# Patient Record
Sex: Male | Born: 1955 | Race: Asian | Hispanic: No | Marital: Married | Smoking: Never smoker
Health system: Southern US, Community
[De-identification: ages and names within clinical notes are randomized; demographics above are authoritative.]

## PROBLEM LIST (undated history)

## (undated) ENCOUNTER — Emergency Department (HOSPITAL_COMMUNITY): Discharge status: Absent without leave | Payer: Self-pay | Attending: Emergency Medicine | Admitting: Emergency Medicine

## (undated) DIAGNOSIS — T7840XA Allergy, unspecified, initial encounter: Secondary | ICD-10-CM

## (undated) HISTORY — PX: TONSILLECTOMY: SUR1361

## (undated) HISTORY — DX: Allergy, unspecified, initial encounter: T78.40XA

---

## 2004-10-12 ENCOUNTER — Emergency Department (HOSPITAL_COMMUNITY): Admission: EM | Admit: 2004-10-12 | Discharge: 2004-10-12 | Payer: Self-pay | Admitting: Emergency Medicine

## 2004-12-08 ENCOUNTER — Ambulatory Visit: Payer: Self-pay | Admitting: Internal Medicine

## 2004-12-10 ENCOUNTER — Ambulatory Visit: Payer: Self-pay | Admitting: Internal Medicine

## 2005-03-01 ENCOUNTER — Ambulatory Visit: Payer: Self-pay | Admitting: Internal Medicine

## 2005-03-05 ENCOUNTER — Ambulatory Visit: Payer: Self-pay | Admitting: Internal Medicine

## 2005-06-24 ENCOUNTER — Ambulatory Visit: Payer: Self-pay | Admitting: Internal Medicine

## 2005-06-25 ENCOUNTER — Ambulatory Visit: Payer: Self-pay | Admitting: Internal Medicine

## 2005-10-14 ENCOUNTER — Ambulatory Visit: Payer: Self-pay | Admitting: Internal Medicine

## 2005-10-22 ENCOUNTER — Ambulatory Visit: Payer: Self-pay | Admitting: Internal Medicine

## 2006-03-04 ENCOUNTER — Ambulatory Visit: Payer: Self-pay | Admitting: Internal Medicine

## 2006-03-08 ENCOUNTER — Ambulatory Visit: Payer: Self-pay | Admitting: Internal Medicine

## 2006-07-01 ENCOUNTER — Ambulatory Visit: Payer: Self-pay | Admitting: Gastroenterology

## 2006-07-25 ENCOUNTER — Ambulatory Visit: Payer: Self-pay | Admitting: Internal Medicine

## 2006-07-27 ENCOUNTER — Ambulatory Visit: Payer: Self-pay | Admitting: Internal Medicine

## 2006-08-02 ENCOUNTER — Ambulatory Visit: Payer: Self-pay | Admitting: Gastroenterology

## 2006-08-22 ENCOUNTER — Inpatient Hospital Stay (HOSPITAL_COMMUNITY): Admission: EM | Admit: 2006-08-22 | Discharge: 2006-08-26 | Payer: Self-pay | Admitting: Emergency Medicine

## 2006-08-22 ENCOUNTER — Ambulatory Visit: Payer: Self-pay | Admitting: Family Medicine

## 2006-08-22 ENCOUNTER — Encounter: Payer: Self-pay | Admitting: *Deleted

## 2007-12-21 IMAGING — RF DG FLUORO GUIDE NDL PLC/BX
1 series · 1 of 1 positions shown · non-contrast
Comparison: none

CLINICAL DATA: 49-year-old, seizures.  Mental status change. 
 FLUOROSCOPIC GUIDED LUMBAR PUNCTURE:
 Procedure:  Informed consent was obtained from the patient?s brother via telephone.  Patient had to be medicated and was not coherent.  He was somewhat combative and we had to do this on his left side.  An appropriate site for lumbar puncture was marked on the skin at the L2-3 level.  The patient was then prepped and draped in the usual sterile fashion and local anesthesia was achieved with 1% Xylocaine.  A 20 gauge spinal needle was then inserted into the thecal sac at the L2-3 level with subsequent free flow of clear CSF.  Approximately 10 cc was obtained for appropriate laboratory evaluation.

[Series 1: run · 1 of 1 slices shown]
[im 1/1]
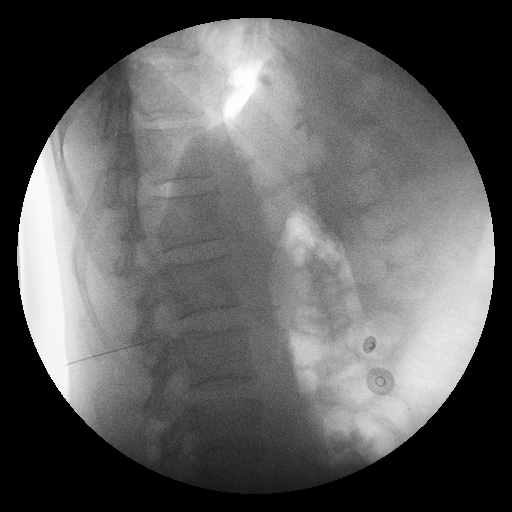

[1 of 1 positions shown; findings below may reference images not displayed]

IMPRESSION: Fluoroscopic guided lumbar puncture with 10 cc of clear CSF obtained for appropriate laboratory evaluation.

## 2011-10-15 ENCOUNTER — Ambulatory Visit (INDEPENDENT_AMBULATORY_CARE_PROVIDER_SITE_OTHER): Payer: BC Managed Care – PPO

## 2011-10-15 DIAGNOSIS — J0100 Acute maxillary sinusitis, unspecified: Secondary | ICD-10-CM

## 2011-10-15 DIAGNOSIS — I10 Essential (primary) hypertension: Secondary | ICD-10-CM

## 2012-01-19 ENCOUNTER — Other Ambulatory Visit: Payer: Self-pay | Admitting: Family Medicine

## 2012-01-24 ENCOUNTER — Other Ambulatory Visit: Payer: Self-pay | Admitting: Family Medicine

## 2012-02-21 ENCOUNTER — Other Ambulatory Visit: Payer: Self-pay | Admitting: Physician Assistant

## 2012-03-01 ENCOUNTER — Other Ambulatory Visit: Payer: Self-pay | Admitting: Family Medicine

## 2012-03-03 ENCOUNTER — Other Ambulatory Visit: Payer: Self-pay | Admitting: Physician Assistant

## 2012-07-24 ENCOUNTER — Other Ambulatory Visit: Payer: Self-pay | Admitting: Physician Assistant

## 2012-07-25 NOTE — Telephone Encounter (Signed)
Please pull paper chart.  

## 2012-07-25 NOTE — Telephone Encounter (Signed)
Chart pulled to PA pool at nurses station [phone]

## 2012-08-01 ENCOUNTER — Ambulatory Visit (INDEPENDENT_AMBULATORY_CARE_PROVIDER_SITE_OTHER): Payer: BC Managed Care – PPO | Admitting: Internal Medicine

## 2012-08-01 VITALS — BP 160/96 | HR 120 | Temp 98.7°F | Resp 18 | Ht 68.5 in | Wt 165.4 lb

## 2012-08-01 DIAGNOSIS — I10 Essential (primary) hypertension: Secondary | ICD-10-CM

## 2012-08-01 DIAGNOSIS — Z0000 Encounter for general adult medical examination without abnormal findings: Secondary | ICD-10-CM

## 2012-08-01 DIAGNOSIS — Z319 Encounter for procreative management, unspecified: Secondary | ICD-10-CM

## 2012-08-01 DIAGNOSIS — R05 Cough: Secondary | ICD-10-CM

## 2012-08-01 DIAGNOSIS — E785 Hyperlipidemia, unspecified: Secondary | ICD-10-CM

## 2012-08-01 DIAGNOSIS — G4700 Insomnia, unspecified: Secondary | ICD-10-CM

## 2012-08-01 DIAGNOSIS — J45909 Unspecified asthma, uncomplicated: Secondary | ICD-10-CM

## 2012-08-01 DIAGNOSIS — J309 Allergic rhinitis, unspecified: Secondary | ICD-10-CM | POA: Insufficient documentation

## 2012-08-01 DIAGNOSIS — K219 Gastro-esophageal reflux disease without esophagitis: Secondary | ICD-10-CM

## 2012-08-01 DIAGNOSIS — Z23 Encounter for immunization: Secondary | ICD-10-CM

## 2012-08-01 DIAGNOSIS — N529 Male erectile dysfunction, unspecified: Secondary | ICD-10-CM

## 2012-08-01 DIAGNOSIS — J019 Acute sinusitis, unspecified: Secondary | ICD-10-CM

## 2012-08-01 DIAGNOSIS — J329 Chronic sinusitis, unspecified: Secondary | ICD-10-CM

## 2012-08-01 LAB — POCT CBC
Lymph, poc: 1.1 (ref 0.6–3.4)
MCH, POC: 29.6 pg (ref 27–31.2)
MCHC: 31.3 g/dL — AB (ref 31.8–35.4)
MCV: 94.7 fL (ref 80–97)
MID (cbc): 0.3 (ref 0–0.9)
POC LYMPH PERCENT: 20.5 %L (ref 10–50)
Platelet Count, POC: 357 10*3/uL (ref 142–424)
RBC: 5.58 M/uL (ref 4.69–6.13)
RDW, POC: 13.3 %
WBC: 5.4 10*3/uL (ref 4.6–10.2)

## 2012-08-01 LAB — COMPREHENSIVE METABOLIC PANEL
AST: 16 U/L (ref 0–37)
BUN: 11 mg/dL (ref 6–23)
Calcium: 10 mg/dL (ref 8.4–10.5)
Chloride: 103 mEq/L (ref 96–112)
Creat: 0.86 mg/dL (ref 0.50–1.35)
Total Bilirubin: 1 mg/dL (ref 0.3–1.2)

## 2012-08-01 LAB — LIPID PANEL
HDL: 41 mg/dL (ref >39)
Total CHOL/HDL Ratio: 4.4 Ratio

## 2012-08-01 MED ORDER — SIMVASTATIN 20 MG PO TABS: 20.0000 mg | ORAL_TABLET | Freq: Every day | ORAL | Status: DC

## 2012-08-01 MED ORDER — ALBUTEROL SULFATE HFA 108 (90 BASE) MCG/ACT IN AERS: 2.0000 | INHALATION_SPRAY | Freq: Four times a day (QID) | RESPIRATORY_TRACT | Status: DC | PRN

## 2012-08-01 MED ORDER — FLUOCINONIDE-E 0.05 % EX CREA: TOPICAL_CREAM | CUTANEOUS | Status: DC

## 2012-08-01 MED ORDER — HYDROCODONE-HOMATROPINE 5-1.5 MG/5ML PO SYRP: 5.0000 mL | ORAL_SOLUTION | Freq: Four times a day (QID) | ORAL | Status: AC | PRN

## 2012-08-01 MED ORDER — AMOXICILLIN 500 MG PO CAPS: 1000.0000 mg | ORAL_CAPSULE | Freq: Two times a day (BID) | ORAL | Status: DC

## 2012-08-01 MED ORDER — VALSARTAN 80 MG PO TABS: 80.0000 mg | ORAL_TABLET | Freq: Every day | ORAL | Status: DC

## 2012-08-01 MED ORDER — BECLOMETHASONE DIPROPIONATE 40 MCG/ACT IN AERS: 2.0000 | INHALATION_SPRAY | Freq: Two times a day (BID) | RESPIRATORY_TRACT | Status: DC

## 2012-08-01 MED ORDER — PANTOPRAZOLE SODIUM 40 MG PO TBEC: 40.0000 mg | DELAYED_RELEASE_TABLET | Freq: Every day | ORAL | Status: DC

## 2012-08-01 MED ORDER — CLONAZEPAM 0.5 MG PO TABS: 0.5000 mg | ORAL_TABLET | Freq: Every evening | ORAL | Status: DC | PRN

## 2012-08-01 NOTE — Progress Notes (Addendum)
Subjective:    Patient ID: Ronald Brennan, male    DOB: 06-11-1956, 56 y.o.   MRN: [id number]  HPIcough 1 week/green sputum occas/no fever/restarted qvar and zyrtec but no better Lots of sinus pressure/allergies are currently active, spite medication  Spots on scalp 6 months/no hair loss/no itching/changing color of hair  Fell-pain in shoulder after falling--4 weeks ago/Slowly improving with use/no nocturnal pain  Spot in mouth,As in past/recent dental evaluation since no problem  Also here for blood work and followup for other medical problems Patient Active Problem List  Diagnosis  . GERD (gastroesophageal reflux disease)-Stable with intermittent medication  . AR (allergic rhinitis)  . Insomnia/anxiety-Stable with intermittent Klonopin for sleep  . HTN (hypertension)-Does not take home blood pressures  . Hyperlipidemia-Continues on Zocor without side effects  . RAD (reactive airway disease)-Stable on medication   Additionally has concerns about the fact that he and his wife have been unable to have a baby/they just returned from 4 monthsIn their home country of [place], Where both were evaluated for infertility. Apparently insemination was attempted but failed. He was sent home with medications that included l-carnitine and clonidine but I'm not sure why-he thinks they were to promote his fertility. They are frustrated and wished to pursue workup here. She is a patient of Dr. Dierdre Forth  Review of Systems No vision changes/no hearing changes No headaches Chest pain or palpitations/no edema No shortness of breath No changes in appetite No genitourinary complaints/? Whether erectle function is intact or Whether anxiety is the culprit    Objective:   Physical Exam Filed Vitals:   08/01/12 1019  BP: 160/96  Pulse: 120  Temp: 98.7 F (37.1 C)  Resp: 18  There are 2 1.5 cm patches in a symmetrical fashion on his occiput of hair that is white/no underlying skin changes  seen Pupils equal round reactive to light and accommodation/extraocular movements conjugate TMs clear Nares boggy with purulent mucus/maxillary is tender to percussion Oral pharynx has a mucoid cyst behind the left lower molar No lymphadenopathy or thyromegaly Lungs clear Heart regular without murmur Extremities with full peripheral pulses/ no edema The left shoulder now has a good range of motion although there is mild discomfort with ab duction against resistance        Assessment & Plan:   1. Cough  HYDROcodone-homatropine (HYCODAN) 5-1.5 MG/5ML syrup, POCT CBC  2. Sinusitis  amoxicillin (AMOXIL) 500 MG capsule  3. GERD (gastroesophageal reflux disease)  pantoprazole (PROTONIX) 40 MG tablet  4. AR (allergic rhinitis)    5. Insomnia/anxiety  clonazePAM (KLONOPIN) 0.5 MG tablet  6. HTN (hypertension)  valsartan (DIOVAN) 80 MG tablet, Comprehensive metabolic panel  7. Hyperlipidemia  simvastatin (ZOCOR) 20 MG tablet, Lipid panel  8. RAD (reactive airway disease)  albuterol (PROAIR HFA) 108 (90 BASE) MCG/ACT inhaler, beclomethasone (QVAR) 40 MCG/ACT inhaler  9. ED (erectile dysfunction)  PSA, Testosterone  10. Infertility management  Ambulatory referral to Urology  11. Preventative health care  Flu vaccine greater than or equal to 3yo preservative free IM  12.Recent shoulder injury resolving  Meds ordered this encounter  Medications  . Multiple Vitamins-Minerals (CENTRUM SILVER ADULT 50+ PO)    Sig: Take by mouth.  . DISCONTD: beclomethasone (QVAR) 40 MCG/ACT inhaler    Sig: Inhale 2 puffs into the lungs 2 (two) times daily.  . cetirizine (ZYRTEC) 10 MG tablet    Sig: Take 10 mg by mouth daily.  Marland Kitchen dextromethorphan-guaiFENesin (MUCINEX DM) 30-600 MG per 12 hr  tablet    Sig: Take 1 tablet by mouth every 12 (twelve) hours.  . fluocinonide-emollient (LIDEX-E) 0.05 % cream    Sig: Apply at bedtime for 1 month    Dispense:  30 g    Refill:  0  . amoxicillin (AMOXIL) 500 MG  capsule    Sig: Take 2 capsules (1,000 mg total) by mouth 2 (two) times daily.    Dispense:  40 capsule    Refill:  0  . HYDROcodone-homatropine (HYCODAN) 5-1.5 MG/5ML syrup    Sig: Take 5 mLs by mouth every 6 (six) hours as needed for cough.    Dispense:  120 mL    Refill:  0  . simvastatin (ZOCOR) 20 MG tablet    Sig: Take 1 tablet (20 mg total) by mouth daily.    Dispense:  90 tablet    Refill:  3  . albuterol (PROAIR HFA) 108 (90 BASE) MCG/ACT inhaler    Sig: Inhale 2 puffs into the lungs every 6 (six) hours as needed for wheezing.    Dispense:  1 Inhaler    Refill:  2  . pantoprazole (PROTONIX) 40 MG tablet    Sig: Take 1 tablet (40 mg total) by mouth daily. Needs office visit    Dispense:  30 tablet    Refill:  0  . valsartan (DIOVAN) 80 MG tablet    Sig: Take 1 tablet (80 mg total) by mouth daily.    Dispense:  90 tablet    Refill:  3  . clonazePAM (KLONOPIN) 0.5 MG tablet    Sig: Take 1 tablet (0.5 mg total) by mouth at bedtime as needed for anxiety.    Dispense:  30 tablet    Refill:  0  . beclomethasone (QVAR) 40 MCG/ACT inhaler    Sig: Inhale 2 puffs into the lungs 2 (two) times daily.    Dispense:  1 Inhaler    Refill:  2   Ref Urology To work on infertility/his wife to see. GYN F/u dentist 4 oral lesion

## 2012-08-02 ENCOUNTER — Encounter: Payer: Self-pay | Admitting: Internal Medicine

## 2012-08-02 LAB — PSA: PSA: 0.94 ng/mL (ref <=4.00)

## 2012-08-05 ENCOUNTER — Telehealth: Payer: Self-pay

## 2012-08-05 DIAGNOSIS — J329 Chronic sinusitis, unspecified: Secondary | ICD-10-CM

## 2012-08-05 MED ORDER — AMOXICILLIN 500 MG PO CAPS: 1000.0000 mg | ORAL_CAPSULE | Freq: Two times a day (BID) | ORAL | Status: AC

## 2012-08-05 NOTE — Telephone Encounter (Signed)
Pt has lost his rx for amoxicillin and would like for Dr to call in anther rx please contact pt [phone]

## 2012-08-05 NOTE — Telephone Encounter (Signed)
Rx sent in

## 2012-08-06 NOTE — Telephone Encounter (Signed)
Pt.notified

## 2012-08-11 ENCOUNTER — Encounter: Payer: Self-pay | Admitting: Internal Medicine

## 2012-08-11 ENCOUNTER — Telehealth: Payer: Self-pay

## 2012-08-11 NOTE — Telephone Encounter (Signed)
The following was originally opened under another pt's chart in error and I have copied the messages and pasted them in to this phone encounter:  Tonye Pearson, MD 08/11/2012 3:10 PM Signed  It was his brother [place]  I sent the letter to you Adela Glimpse 08/10/2012 7:43 PM Signed  PATIENT CALLED IN REGARDS TO WAITING FOR A LETTER FROM DOOLITTLE EXPLAINING SHOULDER PAIN FOR COURT. HE WAS SEEN LAST WEEK AND WAS WONDERING IF IT WAS READY OR WHEN CAN HE COME AND PICK THAT LETTER UP. THANK YOU!

## 2012-08-11 NOTE — Telephone Encounter (Signed)
Notified pt that letter is finished and was mailed to him, but offered to make another copy for p/up. Pt advised he would like to p/up. Copy in drawer.

## 2012-09-18 ENCOUNTER — Telehealth: Payer: Self-pay

## 2012-09-18 NOTE — Telephone Encounter (Signed)
PT IS DR DOOLITTLE'S PT AND WOULD LIKE A CALL BACK FROM HIM AT [phone]

## 2012-09-18 NOTE — Telephone Encounter (Signed)
I have called patient, he wants to know if you know a surgeon for his gums, he is asking for a periodontist. Please advise.

## 2012-09-18 NOTE — Telephone Encounter (Signed)
[phone] drs lutins, benitez and [employer]

## 2012-09-19 ENCOUNTER — Telehealth: Payer: Self-pay

## 2012-09-19 NOTE — Telephone Encounter (Signed)
PATIENT CALLED TO ASK ABOUT PERIODONTIST. I VERIFYED IN CHART WHAT DOOLITTLE WROTE AND TOLD PATIENT OVER PHONE.

## 2012-09-19 NOTE — Telephone Encounter (Signed)
LMOM to CB on both #s.

## 2012-09-24 ENCOUNTER — Ambulatory Visit (INDEPENDENT_AMBULATORY_CARE_PROVIDER_SITE_OTHER): Payer: BC Managed Care – PPO | Admitting: Internal Medicine

## 2012-09-24 VITALS — BP 147/91 | HR 108 | Temp 98.8°F | Resp 16 | Ht 68.34 in | Wt 166.0 lb

## 2012-09-24 DIAGNOSIS — R05 Cough: Secondary | ICD-10-CM

## 2012-09-24 DIAGNOSIS — M25569 Pain in unspecified knee: Secondary | ICD-10-CM

## 2012-09-24 DIAGNOSIS — J329 Chronic sinusitis, unspecified: Secondary | ICD-10-CM

## 2012-09-24 MED ORDER — HYDROCODONE-HOMATROPINE 5-1.5 MG/5ML PO SYRP: 5.0000 mL | ORAL_SOLUTION | Freq: Four times a day (QID) | ORAL | Status: AC | PRN

## 2012-09-24 MED ORDER — AMOXICILLIN 500 MG PO CAPS: 1000.0000 mg | ORAL_CAPSULE | Freq: Two times a day (BID) | ORAL | Status: AC

## 2012-09-24 NOTE — Progress Notes (Signed)
  Subjective:    Patient ID: Ronald Brennan, male    DOB: 18-Jul-1956, 56 y.o.   MRN: [id number]  HPI Cough with postnasal drainage and face pressure for 7 days Started self on leftover amoxicillin and has improved over the last 24 hours No fever Cough nonproductive  Knee pain-2 months after jumping off a truck--using heat  treatments for relief Worse with activity/no swelling/no crepitus/no go black Review of Systems No night sweats/no wheezing/no conjunctivitis or sneezing    Objective:   Physical Exam No acute distress Blood pressure 147/91 TMs clear Nares boggy and purulent Throat clear Chest clear to auscultation  Left knee with tenderness along the medial joint line/pain with pressure there but negative stressor No crepitus/Lachman's and drawer are negative No effusion Patellar ballottement freely       Assessment & Plan:  Problem #1 sinusitis with cough Meds ordered this encounter  Medications  . amoxicillin (AMOXIL) 500 MG capsule    Sig: Take 2 capsules (1,000 mg total) by mouth 2 (two) times daily.    Dispense:  40 capsule    Refill:  0  . HYDROcodone-homatropine (HYCODAN) 5-1.5 MG/5ML syrup    Sig: Take 5 mLs by mouth every 6 (six) hours as needed for cough.    Dispense:  120 mL    Refill:  0   Problem #2 knee pain= tendinitis secondary to recent strain versus mild meniscus injury Continue heat and stretching and follow up if not well 1-2 mos  Recheck blood pressure control and well

## 2012-09-26 ENCOUNTER — Other Ambulatory Visit: Payer: Self-pay | Admitting: Physician Assistant

## 2012-10-12 ENCOUNTER — Telehealth: Payer: Self-pay

## 2012-10-12 NOTE — Telephone Encounter (Signed)
We did see patient on 07/2012 for shoulder pain, but I do not see where you recommended he not wear seat belt, please advise if you can do this. Amy

## 2012-10-12 NOTE — Telephone Encounter (Signed)
Thank you I reprinted letter, will have you sign so he can use this in court. Amy

## 2012-10-12 NOTE — Telephone Encounter (Signed)
PT STATES DR Merla Riches WAS TO WRITE A NOTE STATING THE REASON HE WASN'T WEARING A SEAT BELT IS BECAUSE HE HAD A SHOULDER INJURY REALLY NEED TO HAVE ASAP FOR COURT. PLEASE CALL [phone]

## 2012-10-12 NOTE — Telephone Encounter (Signed)
See letter 11/1 He chose not to wear seat belt cause it hurt---this was prior to visit and he was ticketed prior to visit--wanted note to explain his injury

## 2012-10-14 ENCOUNTER — Ambulatory Visit (INDEPENDENT_AMBULATORY_CARE_PROVIDER_SITE_OTHER): Payer: BC Managed Care – PPO | Admitting: Emergency Medicine

## 2012-10-14 VITALS — BP 171/96 | HR 98 | Temp 97.8°F | Resp 16 | Ht 69.0 in | Wt 173.0 lb

## 2012-10-14 DIAGNOSIS — R21 Rash and other nonspecific skin eruption: Secondary | ICD-10-CM

## 2012-10-14 DIAGNOSIS — H109 Unspecified conjunctivitis: Secondary | ICD-10-CM

## 2012-10-14 DIAGNOSIS — L84 Corns and callosities: Secondary | ICD-10-CM

## 2012-10-14 MED ORDER — OFLOXACIN 0.3 % OP SOLN: 1.0000 [drp] | Freq: Four times a day (QID) | OPHTHALMIC | Status: DC

## 2012-10-14 MED ORDER — HYDROCORTISONE 1 % EX OINT: TOPICAL_OINTMENT | Freq: Two times a day (BID) | CUTANEOUS | Status: DC

## 2012-10-14 MED ORDER — TRIAMCINOLONE 0.1 % CREAM:EUCERIN CREAM 1:1: TOPICAL_CREAM | CUTANEOUS | Status: DC

## 2012-10-14 NOTE — Progress Notes (Signed)
  Subjective:    Patient ID: Ronald Brennan, male    DOB: 1956/07/26, 57 y.o.   MRN: [id number]  HPI pt c/o eyes burning, discharge x a few days. Does not know if he got anything in his eye. He was working in the yard last week. He is itching around his eyes and used a cream from the pharmacy without any relief. He had a cold and was seen here for that. He received Amoxicillin for that, but didn't finish the dose. He is requesting more oral antibiotics but does not have any symptoms of sinusitis or bronchitis at the present time. He does have an area on his right foot he is concerned about. Apparently the skin in that area tends to crack and give him discomfort    Review of Systems     Objective:   Physical Exam the skin around both eyes is somewhat dry and scaly. Pupils themselves are equal and reactive to light. The conjunctiva is mildly injected. Disc margins are sharp . There are no preauricular nodes felt to Examination the right lateral heel reveals the skin to be thickened in this area with a crack through the skin . Blood pressure was repeated and was 160/90      Assessment & Plan:  She will be treated with hydrocortisone cream 1% for the area around his eyes . I placed him on Ocuflox drops daily Zetia and triamcinolone cream for the area around his heel

## 2012-10-14 NOTE — Patient Instructions (Addendum)
Conjunctivitis Conjunctivitis is commonly called "pink eye." Conjunctivitis can be caused by bacterial or viral infection, allergies, or injuries. There is usually redness of the lining of the eye, itching, discomfort, and sometimes discharge. There may be deposits of matter along the eyelids. A viral infection usually causes a watery discharge, while a bacterial infection causes a yellowish, thick discharge. Pink eye is very contagious and spreads by direct contact. You may be given antibiotic eyedrops as part of your treatment. Before using your eye medicine, remove all drainage from the eye by washing gently with warm water and cotton balls. Continue to use the medication until you have awakened 2 mornings in a row without discharge from the eye. Do not rub your eye. This increases the irritation and helps spread infection. Use separate towels from other household members. Wash your hands with soap and water before and after touching your eyes. Use cold compresses to reduce pain and sunglasses to relieve irritation from light. Do not wear contact lenses or wear eye makeup until the infection is gone. SEEK MEDICAL CARE IF:   Your symptoms are not better after 3 days of treatment.  You have increased pain or trouble seeing.  The outer eyelids become very red or swollen. Document Released: 11/04/2004 Document Revised: 12/20/2011 Document Reviewed: 09/27/2005 [employer] Patient Information 2013 [employer].  

## 2012-10-15 ENCOUNTER — Telehealth: Payer: Self-pay

## 2012-10-15 NOTE — Telephone Encounter (Signed)
PATIENT SAW DR. Merla Riches AND HAS SAID HE GOT A WRITTEN RX FOR ANTIBIOTICS AND HAS LOST THE RX FOR AMOXACILLIN 500 MG AND WANTS A NEW ONE. IF ITS POSSIBLE TO GET ONE TODAY...Marland KitchenMarland Kitchen

## 2012-10-16 ENCOUNTER — Telehealth: Payer: Self-pay | Admitting: Radiology

## 2012-10-16 NOTE — Telephone Encounter (Signed)
Patient was given the Rx on 09/24/12. Can not replace it now. Called him to advise. He will come in this evening to see Dr Merla Riches

## 2012-10-16 NOTE — Telephone Encounter (Signed)
Patient states the letter you wrote was not accepted he wants it rewritten to state nothing could touch his shoulder. I have explained to him multiple times you can not change the letter. He is asking if you can write an additional letter to state he could not wear the seatbelt because nothing could touch the shoulder. He is adamant that I send another message to you.,please advise. I told him I will call him back.

## 2012-10-17 ENCOUNTER — Encounter: Payer: Self-pay | Admitting: Internal Medicine

## 2012-10-19 ENCOUNTER — Telehealth: Payer: Self-pay | Admitting: Radiology

## 2012-10-19 NOTE — Telephone Encounter (Signed)
I have spoken to patient again. He is insisting we get him a letter for not

## 2012-10-19 NOTE — Telephone Encounter (Signed)
I have spoken to patient again about his seatbelt ticket advised him additional letters are not going to be provided. He wants to discuss with Dr Merla Riches, I have advised him he can make an appt but we will still be unable to provide him with further letters. He is asking for copies of previous letters. These are put at front desk for him to pick up.

## 2012-11-01 ENCOUNTER — Ambulatory Visit: Payer: BC Managed Care – PPO | Admitting: Internal Medicine

## 2012-11-20 ENCOUNTER — Ambulatory Visit: Payer: BC Managed Care – PPO

## 2012-11-20 ENCOUNTER — Ambulatory Visit (INDEPENDENT_AMBULATORY_CARE_PROVIDER_SITE_OTHER): Payer: BC Managed Care – PPO | Admitting: Internal Medicine

## 2012-11-20 VITALS — BP 169/93 | HR 100 | Temp 98.2°F | Resp 16 | Ht 68.0 in | Wt 170.0 lb

## 2012-11-20 DIAGNOSIS — M25562 Pain in left knee: Secondary | ICD-10-CM

## 2012-11-20 DIAGNOSIS — M25569 Pain in unspecified knee: Secondary | ICD-10-CM

## 2012-11-20 DIAGNOSIS — M7122 Synovial cyst of popliteal space [Baker], left knee: Secondary | ICD-10-CM

## 2012-11-20 DIAGNOSIS — M7120 Synovial cyst of popliteal space [Baker], unspecified knee: Secondary | ICD-10-CM

## 2012-11-20 MED ORDER — AMOXICILLIN 875 MG PO TABS: 875.0000 mg | ORAL_TABLET | Freq: Two times a day (BID) | ORAL | Status: DC

## 2012-11-20 MED ORDER — HYDROCORTISONE 2.5 % RE CREA: TOPICAL_CREAM | Freq: Two times a day (BID) | RECTAL | Status: DC

## 2012-11-20 NOTE — Progress Notes (Signed)
  Subjective:    Patient ID: Ronald Brennan, male    DOB: [birth year], 57 y.o.   MRN: [id number]  HPI has noticed a swelling behind his left knee for 2-3 weeks Relatively painless Has a history of chronic anterior knee pain for 2 years that is intermittent and not disabling Never effusion No history of arthritis Able to exercise  Also has questions about a scalp lesion that is tender for one week/he has similar lesions frequently they come and go without treatment in the scalp  He also would like a refill of his hemorrhoid ointment He would like medicines to have on hand for his recurrent sinus infections when he travels    Review of Systems No fever chills or night sweats No weight loss No chest pain/on meds for hypertension No GI problems/home meds for GERD    Objective:   Physical Exam No acute distress but he is anxious as he often is Blood pressure 163/93 Has a hair follicle irritation on his scalp His left knee has no effusion and no redness He has a full range of motion without pain No instability is noted with stressors There is a large golf ball size cyst in the popliteal area  [facility] reading (PRIMARY) by  Dr. Mikiah Demond=NAD      Assessment & Plan:  P#1 Popliteal Cyst Referred to Dr. Althea Charon for treatment  Problem #2 scalp folliculitis Discussed prevention  Problem 3 recurrent hemorrhoids Refill Anusol HC  Problem #4 recurrent sinusitis Amoxicillin to have at home  #5 hypertension not well controlled He needs to take outside blood pressures over the next 3 weeks and followup if not controlled

## 2012-12-02 ENCOUNTER — Telehealth: Payer: Self-pay | Admitting: *Deleted

## 2012-12-02 NOTE — Telephone Encounter (Signed)
[facility] [address] requesting refill on protonix 40mg . Last fill 09/27/12

## 2012-12-03 MED ORDER — PANTOPRAZOLE SODIUM 40 MG PO TBEC: 40.0000 mg | DELAYED_RELEASE_TABLET | Freq: Every day | ORAL | Status: DC

## 2012-12-03 NOTE — Telephone Encounter (Signed)
Done

## 2013-04-29 ENCOUNTER — Other Ambulatory Visit: Payer: Self-pay | Admitting: Internal Medicine

## 2013-04-29 NOTE — Telephone Encounter (Signed)
Patient wants Diovan (non-generic version) sent to [facility] on [address]. [phone]

## 2013-05-11 ENCOUNTER — Telehealth: Payer: Self-pay

## 2013-05-11 MED ORDER — AMOXICILLIN 875 MG PO TABS: 875.0000 mg | ORAL_TABLET | Freq: Two times a day (BID) | ORAL | Status: DC

## 2013-05-11 NOTE — Telephone Encounter (Signed)
Relapse Meds ordered this encounter  Medications  . amoxicillin (AMOXIL) 875 MG tablet    Sig: Take 1 tablet (875 mg total) by mouth 2 (two) times daily. For sinusitis    Dispense:  20 tablet    Refill:  0

## 2013-05-11 NOTE — Telephone Encounter (Signed)
Can not do this without office visit. Called patient to advise. He states he was told by Dr Merla Riches he could do this, please advise.

## 2013-05-11 NOTE — Telephone Encounter (Signed)
Pt states that he would like to have amoxicillin for a sinus infection. Best# [phone]

## 2013-05-14 NOTE — Telephone Encounter (Signed)
Patient advised this was sent in.

## 2013-07-06 ENCOUNTER — Other Ambulatory Visit: Payer: Self-pay | Admitting: Physician Assistant

## 2013-07-07 ENCOUNTER — Other Ambulatory Visit: Payer: Self-pay

## 2013-08-05 ENCOUNTER — Ambulatory Visit (INDEPENDENT_AMBULATORY_CARE_PROVIDER_SITE_OTHER): Payer: BC Managed Care – PPO | Admitting: Internal Medicine

## 2013-08-05 ENCOUNTER — Encounter: Payer: Self-pay | Admitting: Internal Medicine

## 2013-08-05 VITALS — BP 160/86 | HR 90 | Temp 98.2°F | Resp 18 | Ht 68.0 in | Wt 170.4 lb

## 2013-08-05 DIAGNOSIS — R319 Hematuria, unspecified: Secondary | ICD-10-CM

## 2013-08-05 DIAGNOSIS — J019 Acute sinusitis, unspecified: Secondary | ICD-10-CM

## 2013-08-05 DIAGNOSIS — E041 Nontoxic single thyroid nodule: Secondary | ICD-10-CM

## 2013-08-05 DIAGNOSIS — R109 Unspecified abdominal pain: Secondary | ICD-10-CM

## 2013-08-05 LAB — POCT URINALYSIS DIPSTICK
Glucose, UA: NEGATIVE
Nitrite, UA: NEGATIVE
Spec Grav, UA: 1.02
Urobilinogen, UA: 0.2
pH, UA: 7

## 2013-08-05 LAB — POCT CBC
Hemoglobin: 14.9 g/dL (ref 14.1–18.1)
MPV: 8.1 fL (ref 0–99.8)
POC Granulocyte: 3.2 (ref 2–6.9)
POC MID %: 7.4 %M (ref 0–12)
RBC: 4.94 M/uL (ref 4.69–6.13)

## 2013-08-05 LAB — POCT UA - MICROSCOPIC ONLY
Casts, Ur, LPF, POC: NEGATIVE
Mucus, UA: POSITIVE
Yeast, UA: NEGATIVE

## 2013-08-05 MED ORDER — AMOXICILLIN 875 MG PO TABS: 875.0000 mg | ORAL_TABLET | Freq: Two times a day (BID) | ORAL | Status: DC

## 2013-08-05 NOTE — Patient Instructions (Signed)
There is a thyroid nodule on the left side of your thyroid  If your blood test does not give [place] a reason why then we will order a thyroid ultrasound  Your urine test reveals hematuria. This suggests a possible kidney stone. There are other causes and we will have a urologist evaluate you to do all the necessary tests  We will call  tomorrow or Tuesday with your appointment time

## 2013-08-05 NOTE — Progress Notes (Signed)
Subjective:    Patient ID: Ronald Brennan, male    DOB: 1955-11-03, 57 y.o.   MRN: [id number]  HPI Comments: Ronald Brennan is a 57 y.o. male who presents to the Emergency Department complaining of sinusitis. Pt reports having "bad drainage". He also states he frequently coughing without production.He denies any fever.   Sinusitis This is a recurrent problem. The current episode started in the past 7 days. The problem has been gradually worsening since onset. There has been no fever. His pain is at a severity of 2/10. The pain is mild. Associated symptoms include chills, congestion and coughing. Pertinent negatives include no ear pain, hoarse voice, shortness of breath, sneezing, sore throat or swollen glands. Past treatments include oral decongestants. The treatment provided no relief.  Back Pain Pertinent negatives include no fever.  R flank-on/off month No dysuria/freq/hesitation NKI   Review of Systems  Constitutional: Positive for chills. Negative for fever.  HENT: Positive for congestion. Negative for ear pain, hoarse voice, sneezing and sore throat.   Respiratory: Positive for cough. Negative for shortness of breath.   Musculoskeletal: Positive for back pain.   Wife is currently in [place]/ father has throat malignancy and mother has GYN malignancy    Objective:   Physical Exam  Constitutional: He is oriented to person, place, and time. He appears well-developed and well-nourished.  HENT:  Right Ear: External ear normal.  Left Ear: External ear normal.  Mouth/Throat: Oropharynx is clear and moist.  Boggy turbinates with purulent  Eyes: Conjunctivae and EOM are normal. Pupils are equal, round, and reactive to light.  Neck: Normal range of motion. Thyromegaly present.  Left upper pole thyroid nodule nontender  Cardiovascular: Normal rate.   Pulmonary/Chest: Effort normal.  Lymphadenopathy:    He has no cervical adenopathy.  Neurological: He is alert and oriented to  person, place, and time.  Right flank is nontender to palpation and percussion Straight leg raise is negative at 90  Psychiatric: He has a normal mood and affect.   BP 160/86  Pulse 90  Temp(Src) 98.2 F (36.8 C) (Oral)  Resp 18  Ht 5\' 8"  (1.727 m)  Wt 170 lb 6.4 oz (77.293 kg)  BMI 25.92 kg/m2  SpO2 100%        Results for orders placed in visit on 08/05/13  POCT CBC      Result Value Range   WBC 5.1  4.6 - 10.2 K/uL   Lymph, poc 1.5  0.6 - 3.4   POC LYMPH PERCENT 30.2  10 - 50 %L   MID (cbc) 0.4  0 - 0.9   POC MID % 7.4  0 - 12 %M   POC Granulocyte 3.2  2 - 6.9   Granulocyte percent 62.4  37 - 80 %G   RBC 4.94  4.69 - 6.13 M/uL   Hemoglobin 14.9  14.1 - 18.1 g/dL   HCT, POC 78.2  95.6 - 53.7 %   MCV 95.4  80 - 97 fL   MCH, POC 30.2  27 - 31.2 pg   MCHC 31.6 (*) 31.8 - 35.4 g/dL   RDW, POC 21.3     Platelet Count, POC 271  142 - 424 K/uL   MPV 8.1  0 - 99.8 fL  POCT UA - MICROSCOPIC ONLY      Result Value Range   WBC, Ur, HPF, POC 1-2     RBC, urine, microscopic 14-19     Bacteria, U Microscopic trace  Mucus, UA pos     Epithelial cells, urine per micros neg     Crystals, Ur, HPF, POC neg     Casts, Ur, LPF, POC neg     Yeast, UA neg    POCT URINALYSIS DIPSTICK      Result Value Range   Color, UA yellow     Clarity, UA clear     Glucose, UA neg     Bilirubin, UA neg     Ketones, UA neg     Spec Grav, UA 1.020     Blood, UA trace     pH, UA 7.0     Protein, UA neg'     Urobilinogen, UA 0.2     Nitrite, UA neg     Leukocytes, UA Negative      Assessment & Plan:  Acute sinusitis, unspecified -amoxicillin(he has already had 2 doses of leftover medicine at home)  Right flank pain--note hematuria  Thyroid nodule - Plan: TSH, T4, Free//ultrasound  Hematuria - Plan: Urine culture, Comprehensive metabolic panel, urology referral to consider renal scan versus cystoscopy first

## 2013-08-06 LAB — COMPREHENSIVE METABOLIC PANEL
Alkaline Phosphatase: 45 U/L (ref 39–117)
BUN: 13 mg/dL (ref 6–23)
Calcium: 9.5 mg/dL (ref 8.4–10.5)
Creat: 0.87 mg/dL (ref 0.50–1.35)
Glucose, Bld: 95 mg/dL (ref 70–99)
Potassium: 3.9 mEq/L (ref 3.5–5.3)
Sodium: 138 mEq/L (ref 135–145)
Total Bilirubin: 0.6 mg/dL (ref 0.3–1.2)
Total Protein: 7.1 g/dL (ref 6.0–8.3)

## 2013-08-06 LAB — TSH: TSH: 2.355 u[IU]/mL (ref 0.350–4.500)

## 2013-08-06 LAB — T4, FREE: Free T4: 1.08 ng/dL (ref 0.80–1.80)

## 2013-08-07 LAB — URINE CULTURE: Colony Count: NO GROWTH

## 2013-08-08 ENCOUNTER — Encounter: Payer: Self-pay | Admitting: Internal Medicine

## 2013-08-08 ENCOUNTER — Ambulatory Visit
Admission: RE | Admit: 2013-08-08 | Discharge: 2013-08-08 | Disposition: A | Discharge status: Home / Self Care | Payer: BC Managed Care – PPO | Source: Ambulatory Visit | Attending: Internal Medicine | Admitting: Internal Medicine

## 2013-08-08 DIAGNOSIS — E041 Nontoxic single thyroid nodule: Secondary | ICD-10-CM

## 2013-10-03 ENCOUNTER — Other Ambulatory Visit: Payer: Self-pay | Admitting: Internal Medicine

## 2013-10-11 ENCOUNTER — Other Ambulatory Visit: Payer: Self-pay | Admitting: Emergency Medicine

## 2013-10-11 NOTE — Telephone Encounter (Signed)
Dr Cleta Albertsaub, do you want to RF or does pt need re-eval?

## 2013-11-05 ENCOUNTER — Telehealth: Payer: Self-pay

## 2013-11-05 DIAGNOSIS — E785 Hyperlipidemia, unspecified: Secondary | ICD-10-CM

## 2013-11-05 DIAGNOSIS — I10 Essential (primary) hypertension: Secondary | ICD-10-CM

## 2013-11-05 MED ORDER — SIMVASTATIN 20 MG PO TABS: 20.0000 mg | ORAL_TABLET | Freq: Every day | ORAL | Status: DC

## 2013-11-05 MED ORDER — VALSARTAN 80 MG PO TABS: 80.0000 mg | ORAL_TABLET | Freq: Every day | ORAL | Status: DC

## 2013-11-05 NOTE — Telephone Encounter (Signed)
The patient was seen here 07/2013, though not for HTN.  BP elevated at that time, possibly due to sinusitis and back pain. Last lipid profile in 2013, but CMET was normal in 07/2013.  Rx's refilled, 90-days each.  Please remind this patient that we are open 365 days/year, and unless he has a VERY unusual schedule, can probably come in without taking time out of work. He will need an OV and fasting labs for the next fill.

## 2013-11-05 NOTE — Telephone Encounter (Signed)
Can we refill these meds?

## 2013-11-05 NOTE — Telephone Encounter (Signed)
Pt has run out of simvastatin and diovan. He is requesting 90 day supply. He does not have any time available right now to come in. Last seen in 2013 for his BP.   Please advise 90 day supply.

## 2013-11-05 NOTE — Telephone Encounter (Signed)
Can we refill his meds? Simvastatin and diovan

## 2013-11-05 NOTE — Telephone Encounter (Signed)
Patient is calling to get a refill on his diovan and simvastatin please call [phone]

## 2013-11-05 NOTE — Addendum Note (Signed)
Addended by: Fernande BrasJEFFERY, Murlene Revell S on: 11/05/2013 01:49 PM   Modules accepted: Orders

## 2013-11-05 NOTE — Telephone Encounter (Signed)
Lm for pt. Rx's sent in and pt will need OV for further refills. Gave hours of the Urgent care and stressed he would need to come in before this script runs.

## 2014-01-05 ENCOUNTER — Other Ambulatory Visit: Payer: Self-pay | Admitting: Internal Medicine

## 2014-01-05 MED ORDER — BECLOMETHASONE DIPROPIONATE 40 MCG/ACT IN AERS: 2.0000 | INHALATION_SPRAY | Freq: Two times a day (BID) | RESPIRATORY_TRACT | Status: DC

## 2014-01-31 ENCOUNTER — Other Ambulatory Visit: Payer: Self-pay | Admitting: Internal Medicine

## 2014-03-04 ENCOUNTER — Ambulatory Visit (INDEPENDENT_AMBULATORY_CARE_PROVIDER_SITE_OTHER): Payer: BLUE CROSS/BLUE SHIELD | Admitting: Internal Medicine

## 2014-03-04 VITALS — BP 172/92 | HR 98 | Temp 98.7°F | Resp 17 | Ht 68.5 in | Wt 174.0 lb

## 2014-03-04 DIAGNOSIS — Z1211 Encounter for screening for malignant neoplasm of colon: Secondary | ICD-10-CM

## 2014-03-04 DIAGNOSIS — Z0000 Encounter for general adult medical examination without abnormal findings: Secondary | ICD-10-CM

## 2014-03-04 DIAGNOSIS — I10 Essential (primary) hypertension: Secondary | ICD-10-CM | POA: Diagnosis not present

## 2014-03-04 DIAGNOSIS — E785 Hyperlipidemia, unspecified: Secondary | ICD-10-CM | POA: Diagnosis not present

## 2014-03-04 LAB — COMPREHENSIVE METABOLIC PANEL
ALT: 23 U/L (ref 0–53)
AST: 15 U/L (ref 0–37)
Albumin: 4.6 g/dL (ref 3.5–5.2)
Alkaline Phosphatase: 40 U/L (ref 39–117)
BILIRUBIN TOTAL: 0.5 mg/dL (ref 0.2–1.2)
BUN: 11 mg/dL (ref 6–23)
CO2: 25 mEq/L (ref 19–32)
Calcium: 9.2 mg/dL (ref 8.4–10.5)
Chloride: 103 mEq/L (ref 96–112)
Creat: 0.86 mg/dL (ref 0.50–1.35)
Glucose, Bld: 121 mg/dL — ABNORMAL HIGH (ref 70–99)
Potassium: 4 mEq/L (ref 3.5–5.3)
SODIUM: 137 meq/L (ref 135–145)
TOTAL PROTEIN: 6.8 g/dL (ref 6.0–8.3)

## 2014-03-04 LAB — LIPID PANEL
CHOL/HDL RATIO: 4 ratio
Cholesterol: 165 mg/dL (ref 0–200)
HDL: 41 mg/dL (ref >39)
LDL Cholesterol: 83 mg/dL (ref 0–99)
Triglycerides: 204 mg/dL — ABNORMAL HIGH (ref <150)
VLDL: 41 mg/dL — AB (ref 0–40)

## 2014-03-04 LAB — CBC
HCT: 45.7 % (ref 39.0–52.0)
Hemoglobin: 16.6 g/dL (ref 13.0–17.0)
MCH: 31.1 pg (ref 26.0–34.0)
MCHC: 36.3 g/dL — ABNORMAL HIGH (ref 30.0–36.0)
MCV: 85.6 fL (ref 78.0–100.0)
PLATELETS: 266 10*3/uL (ref 150–400)
RBC: 5.34 MIL/uL (ref 4.22–5.81)
RDW: 13 % (ref 11.5–15.5)
WBC: 4.4 10*3/uL (ref 4.0–10.5)

## 2014-03-04 LAB — PSA: PSA: 0.87 ng/mL (ref <=4.00)

## 2014-03-04 LAB — TSH: TSH: 1.947 u[IU]/mL (ref 0.350–4.500)

## 2014-03-04 MED ORDER — PANTOPRAZOLE SODIUM 40 MG PO TBEC: 40.0000 mg | DELAYED_RELEASE_TABLET | Freq: Every day | ORAL | Status: AC

## 2014-03-04 MED ORDER — BECLOMETHASONE DIPROPIONATE 40 MCG/ACT IN AERS: 2.0000 | INHALATION_SPRAY | Freq: Two times a day (BID) | RESPIRATORY_TRACT | Status: DC

## 2014-03-04 MED ORDER — AMOXICILLIN 875 MG PO TABS: 875.0000 mg | ORAL_TABLET | Freq: Two times a day (BID) | ORAL | Status: DC

## 2014-03-04 MED ORDER — CLONAZEPAM 0.5 MG PO TABS: ORAL_TABLET | ORAL | Status: DC

## 2014-03-04 MED ORDER — VALSARTAN 160 MG PO TABS: ORAL_TABLET | ORAL | Status: AC

## 2014-03-04 MED ORDER — HYDROCORTISONE 2.5 % RE CREA: 1.0000 "application " | TOPICAL_CREAM | Freq: Two times a day (BID) | RECTAL | Status: DC

## 2014-03-04 MED ORDER — BD ASSURE BPM/AUTO ARM CUFF MISC: 1.0000 | Freq: Every day | Status: AC

## 2014-03-04 MED ORDER — SIMVASTATIN 20 MG PO TABS: ORAL_TABLET | ORAL | Status: AC

## 2014-03-04 NOTE — Progress Notes (Signed)
Subjective:  This chart was scribed for Dr. Ellamae Sia, MD by Leone Payor, ED Scribe. This patient was seen in room 2 and the patient's care was started 9:16 AM.   Patient ID: Ronald Brennan, male    DOB: 28-Nov-1955, 58 y.o.   MRN: [id number]  HPI HPI Comments: [name] is a 58 y.o. male with past medical history of HTN, GERD, HLD who presents to [facility] requesting medication refill and physical exam today. Patient states he is traveling abroad to [place] for at least 1 month this summer and would like to have a refill on QVAR and amoxicillin in case he develops a respiratory infection. He also has hemorrhoids and would like a refill for that cream. He also requests a refill on Protonix.   Patient states he does not recall having a pneumonia vaccine recently. He last had a partial colonoscopy about 5 years ago. Patient states his GERD symptoms are under control. He reports increased stress from work which he believes is causing him to have elevated blood pressure, but states he is not checking it regularly at home.   Patient Active Problem List   Diagnosis Date Noted  . GERD (gastroesophageal reflux disease)-stable on Protonix  08/01/2012  . AR (allergic rhinitis)-responds to medication  08/01/2012  . Insomnia/anxiety--very occasional Klonopin  08/01/2012  . HTN (hypertension)--frequently nervous and even afraid to take his blood pressure at home  08/01/2012  . Hyperlipidemia-no side effects from medicines  08/01/2012  . RAD (reactive airway disease)--this occurs mainly in the setting of sinus infections and responds easily Qvar with occasional albuterol  08/01/2012   Immunizations up to date with last tetanus 2011 and initial Pneumovax 2008 He has had hepatitis B series He has had colonoscopies in the past although at his last attempt had a dehydration reaction to colon cleansing  Dr. Randa Evens did a sigmoidoscopy and stent in 2008 and the patient feels like it's time to repeat a  regular colonoscopy   Review of Systems  Musculoskeletal: Positive for myalgias.  Allergic/Immunologic: Positive for environmental allergies (seasonal).  All other systems reviewed and are negative.      Objective:   Physical Exam  Nursing note and vitals reviewed. Constitutional: He is oriented to person, place, and time. He appears well-developed and well-nourished.  HENT:  Head: Normocephalic and atraumatic.  Right Ear: External ear normal.  Left Ear: External ear normal.  Nose: Nose normal.  Mouth/Throat: Oropharynx is clear and moist.  Tms and canals clear  Eyes: Conjunctivae and EOM are normal. Pupils are equal, round, and reactive to light.  Neck: Normal range of motion. Neck supple. No thyromegaly present.  Cardiovascular: Normal rate, regular rhythm, normal heart sounds and intact distal pulses.   No murmur heard. Pulmonary/Chest: Effort normal and breath sounds normal. No respiratory distress. He has no wheezes. He has no rales.  Abdominal: Soft. Bowel sounds are normal. He exhibits no distension and no mass. There is no tenderness. There is no rebound and no guarding.  No hepatosplenomegaly  Musculoskeletal: Normal range of motion. He exhibits no edema and no tenderness.  Lymphadenopathy:    He has no cervical adenopathy.  Neurological: He is alert and oriented to person, place, and time. He has normal reflexes. No cranial nerve deficit. He exhibits normal muscle tone. Coordination normal.  Skin: Skin is warm and dry. No rash noted.  Psychiatric: He has a normal mood and affect. His behavior is normal. Judgment and thought content normal.  BP 172/92  Pulse 98  Temp(Src) 98.7 F (37.1 C) (Oral)  Resp 17  Ht 5' 8.5" (1.74 m)  Wt 174 lb (78.926 kg)  BMI 26.07 kg/m2  SpO2 97%     Assessment & Plan:  I have completed the patient encounter in its entirety as documented by the scribe, with editing by me where necessary. Robert P. Merla Richesoolittle, M.D.  Routine general  medical examination at a health care facility - Plan: CBC, Comprehensive metabolic panel, TSH, Lipid panel, PSA  Other and unspecified hyperlipidemia - Plan: Comprehensive metabolic panel  Unspecified essential hypertension - Plan: CBC, Comprehensive metabolic panel  Screening for colon cancer - Plan: Ambulatory referral to Gastroenterology  Meds ordered this encounter  Medications  . beclomethasone (QVAR) 40 MCG/ACT inhaler------ uses when necessary for reactive airway disease     Sig: Inhale 2 puffs into the lungs 2 (two) times daily.    Dispense:  1 Inhaler    Refill:  12  . amoxicillin (AMOXIL) 875 MG tablet----- He takes this on his trips to [place] where he gets sinus infections     Sig: Take 1 tablet (875 mg total) by mouth 2 (two) times daily.    Dispense:  20 tablet    Refill:  1  . pantoprazole (PROTONIX) 40 MG tablet    Sig: Take 1 tablet (40 mg total) by mouth daily.    Dispense:  90 tablet    Refill:  3  . simvastatin (ZOCOR) 20 MG tablet    Sig: TAKE 1 TABLET BY MOUTH DAILY.    Dispense:  90 tablet    Refill:  3  . valsartan (DIOVAN) 160 MG tablet    Sig: TAKE 1 TABLET BY MOUTH DAILY.    Dispense:  90 tablet    Refill:  3  . hydrocortisone (ANUSOL-HC) 2.5 % rectal cream    Sig: Place 1 application rectally 2 (two) times daily.    Dispense:  30 g    Refill:  3  . Blood Pressure Monitoring (B-D ASSURE BPM/AUTO ARM CUFF) MISC    Sig: 1 Device by Does not apply route daily.    Dispense:  1 each    Refill:  0  . clonazePAM (KLONOPIN) 0.5 MG tablet    Sig: TAKE 1/2 TO 1 TABLET BY MOUTH EVERY 12 HOURS AS NEEDED    Dispense:  30 tablet    Refill:  0   Refer to Dr. Randa EvensEdwards

## 2014-03-06 ENCOUNTER — Encounter: Payer: Self-pay | Admitting: Internal Medicine

## 2014-03-20 IMAGING — CR DG KNEE 1-2V*L*
2 series · 2 of 2 positions shown · non-contrast
Comparison: None

CLINICAL DATA: Swelling behind left knee for 2-3 weeks, history
chronic anterior knee pain for 2 years

LEFT KNEE - 1-2 VIEW

[AP]
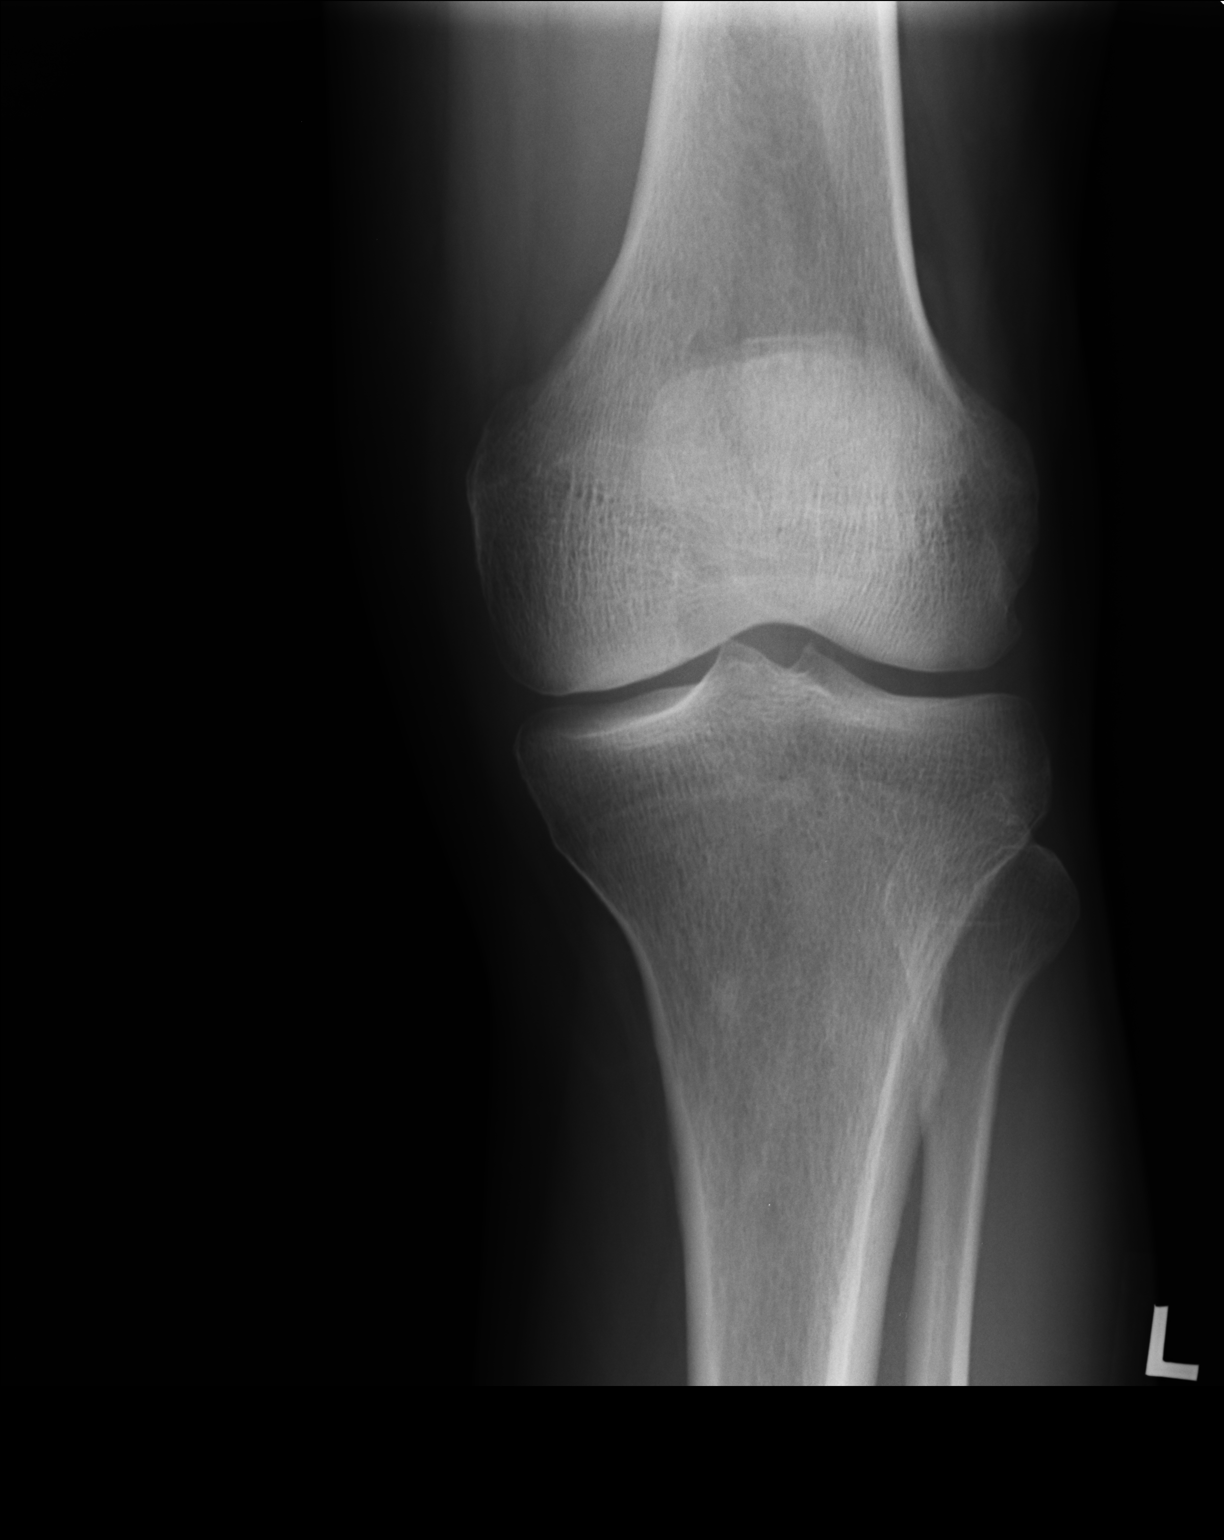

[lateral]
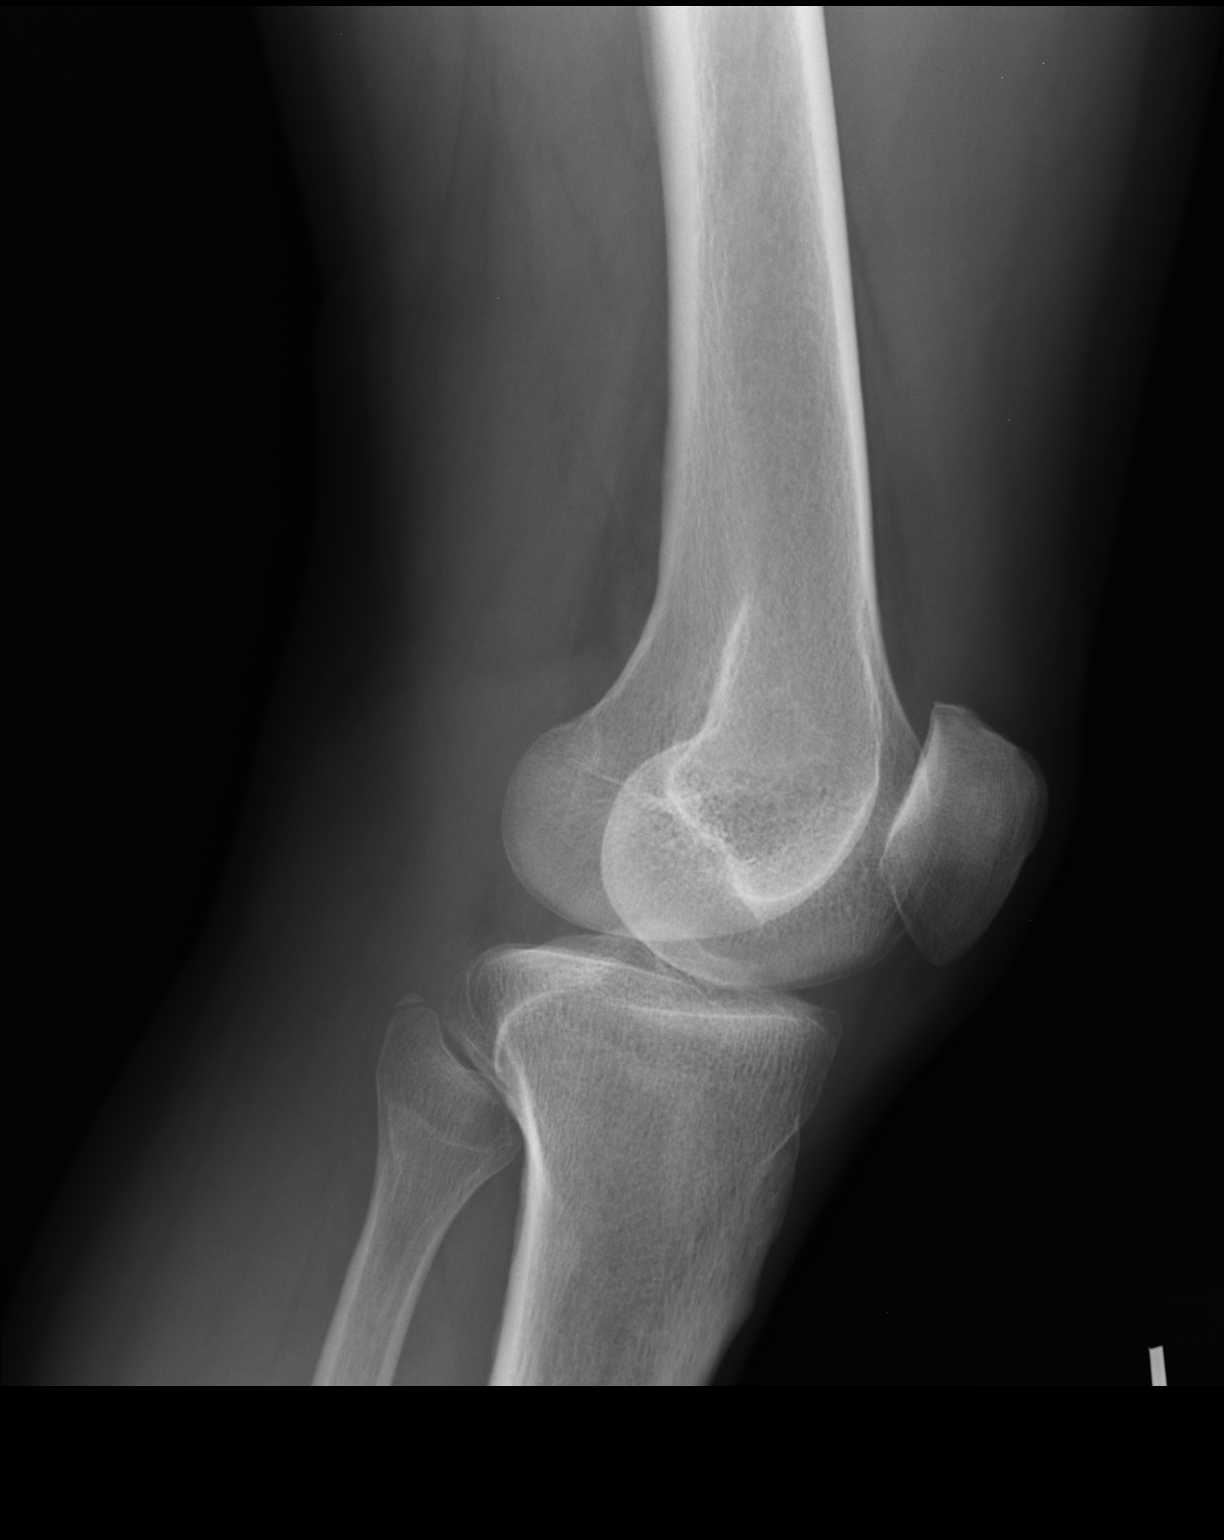

[2 of 2 positions shown; findings below may reference images not displayed]

FINDINGS: Osseous mineralization grossly normal for technique.
Joint spaces preserved.
Obliquity on lateral view.
No acute fracture, dislocation, or bone destruction.
No definite knee joint effusion.
IMPRESSION: No acute abnormalities.

## 2014-03-28 ENCOUNTER — Encounter: Payer: Self-pay | Admitting: Family Medicine

## 2014-04-26 ENCOUNTER — Telehealth: Payer: Self-pay

## 2014-04-26 ENCOUNTER — Ambulatory Visit (INDEPENDENT_AMBULATORY_CARE_PROVIDER_SITE_OTHER): Payer: Self-pay | Admitting: Internal Medicine

## 2014-04-26 VITALS — BP 162/82 | HR 113 | Temp 97.5°F | Resp 18 | Ht 68.25 in | Wt 171.2 lb

## 2014-04-26 DIAGNOSIS — J029 Acute pharyngitis, unspecified: Secondary | ICD-10-CM

## 2014-04-26 DIAGNOSIS — R059 Cough, unspecified: Secondary | ICD-10-CM

## 2014-04-26 DIAGNOSIS — J329 Chronic sinusitis, unspecified: Secondary | ICD-10-CM

## 2014-04-26 DIAGNOSIS — F411 Generalized anxiety disorder: Secondary | ICD-10-CM

## 2014-04-26 DIAGNOSIS — G4700 Insomnia, unspecified: Secondary | ICD-10-CM

## 2014-04-26 DIAGNOSIS — R05 Cough: Secondary | ICD-10-CM

## 2014-04-26 MED ORDER — CLONAZEPAM 0.5 MG PO TABS: ORAL_TABLET | ORAL | Status: AC

## 2014-04-26 MED ORDER — AZITHROMYCIN 500 MG PO TABS: 500.0000 mg | ORAL_TABLET | Freq: Every day | ORAL | Status: DC

## 2014-04-26 MED ORDER — HYDROCODONE-ACETAMINOPHEN 7.5-325 MG/15ML PO SOLN: 5.0000 mL | Freq: Four times a day (QID) | ORAL | Status: DC | PRN

## 2014-04-26 NOTE — Telephone Encounter (Signed)
Patient says he would like this switched to amoxcillan because that was what he was comfortable taking before and is worried about the bad side effects of azithromycin

## 2014-04-26 NOTE — Telephone Encounter (Signed)
Pt called and said that he doesn't want what he was prescribed today because he researched it online, and there are too many side affects, and would like to know if we can prescribe him amoxicillin

## 2014-04-26 NOTE — Patient Instructions (Addendum)
Pharyngitis Pharyngitis is redness, pain, and swelling (inflammation) of your pharynx.  CAUSES  Pharyngitis is usually caused by infection. Most of the time, these infections are from viruses (viral) and are part of a cold. However, sometimes pharyngitis is caused by bacteria (bacterial). Pharyngitis can also be caused by allergies. Viral pharyngitis may be spread from person to person by coughing, sneezing, and personal items or utensils (cups, forks, spoons, toothbrushes). Bacterial pharyngitis may be spread from person to person by more intimate contact, such as kissing.  SIGNS AND SYMPTOMS  Symptoms of pharyngitis include:   Sore throat.   Tiredness (fatigue).   Low-grade fever.   Headache.  Joint pain and muscle aches.  Skin rashes.  Swollen lymph nodes.  Plaque-like film on throat or tonsils (often seen with bacterial pharyngitis). DIAGNOSIS  Your health care provider will ask you questions about your illness and your symptoms. Your medical history, along with a physical exam, is often all that is needed to diagnose pharyngitis. Sometimes, a rapid strep test is done. Other lab tests may also be done, depending on the suspected cause.  TREATMENT  Viral pharyngitis will usually get better in 3-4 days without the use of medicine. Bacterial pharyngitis is treated with medicines that kill germs (antibiotics).  HOME CARE INSTRUCTIONS   Drink enough water and fluids to keep your urine clear or pale yellow.   Only take over-the-counter or prescription medicines as directed by your health care provider:   If you are prescribed antibiotics, make sure you finish them even if you start to feel better.   Do not take aspirin.   Get lots of rest.   Gargle with 8 oz of salt water ( tsp of salt per 1 qt of water) as often as every 1-2 hours to soothe your throat.   Throat lozenges (if you are not at risk for choking) or sprays may be used to soothe your throat. SEEK MEDICAL  CARE IF:   You have large, tender lumps in your neck.  You have a rash.  You cough up green, yellow-brown, or bloody spit. SEEK IMMEDIATE MEDICAL CARE IF:   Your neck becomes stiff.  You drool or are unable to swallow liquids.  You vomit or are unable to keep medicines or liquids down.  You have severe pain that does not go away with the use of recommended medicines.  You have trouble breathing (not caused by a stuffy nose). MAKE SURE YOU:   Understand these instructions.  Will watch your condition.  Will get help right away if you are not doing well or get worse. Document Released: 09/27/2005 Document Revised: 07/18/2013 Document Reviewed: 06/04/2013 [facility] Patient Information 2015 [employer] [place]. This information is not intended to replace advice given to you by your health care provider. Make sure you discuss any questions you have with your health care provider. Sinusitis Sinusitis is redness, soreness, and swelling (inflammation) of the paranasal sinuses. Paranasal sinuses are air pockets within the bones of your face (beneath the eyes, the middle of the forehead, or above the eyes). In healthy paranasal sinuses, mucus is able to drain out, and air is able to circulate through them by way of your nose. However, when your paranasal sinuses are inflamed, mucus and air can become trapped. This can allow bacteria and other germs to grow and cause infection. Sinusitis can develop quickly and last only a short time (acute) or continue over a long period (chronic). Sinusitis that lasts for more than 12 weeks is  considered chronic.  CAUSES  Causes of sinusitis include:  Allergies.  Structural abnormalities, such as displacement of the cartilage that separates your nostrils (deviated septum), which can decrease the air flow through your nose and sinuses and affect sinus drainage.  Functional abnormalities, such as when the small hairs (cilia) that line your sinuses and help remove  mucus do not work properly or are not present. SYMPTOMS  Symptoms of acute and chronic sinusitis are the same. The primary symptoms are pain and pressure around the affected sinuses. Other symptoms include:  Upper toothache.  Earache.  Headache.  Bad breath.  Decreased sense of smell and taste.  A cough, which worsens when you are lying flat.  Fatigue.  Fever.  Thick drainage from your nose, which often is green and may contain pus (purulent).  Swelling and warmth over the affected sinuses. DIAGNOSIS  Your caregiver will perform a physical exam. During the exam, your caregiver may:  Look in your nose for signs of abnormal growths in your nostrils (nasal polyps).  Tap over the affected sinus to check for signs of infection.  View the inside of your sinuses (endoscopy) with a special imaging device with a light attached (endoscope), which is inserted into your sinuses. If your caregiver suspects that you have chronic sinusitis, one or more of the following tests may be recommended:  Allergy tests.  Nasal culture--A sample of mucus is taken from your nose and sent to a lab and screened for bacteria.  Nasal cytology--A sample of mucus is taken from your nose and examined by your caregiver to determine if your sinusitis is related to an allergy. TREATMENT  Most cases of acute sinusitis are related to a viral infection and will resolve on their own within 10 days. Sometimes medicines are prescribed to help relieve symptoms (pain medicine, decongestants, nasal steroid sprays, or saline sprays).  However, for sinusitis related to a bacterial infection, your caregiver will prescribe antibiotic medicines. These are medicines that will help kill the bacteria causing the infection.  Rarely, sinusitis is caused by a fungal infection. In theses cases, your caregiver will prescribe antifungal medicine. For some cases of chronic sinusitis, surgery is needed. Generally, these are cases in  which sinusitis recurs more than 3 times per year, despite other treatments. HOME CARE INSTRUCTIONS   Drink plenty of water. Water helps thin the mucus so your sinuses can drain more easily.  Use a humidifier.  Inhale steam 3 to 4 times a day (for example, sit in the bathroom with the shower running).  Apply a warm, moist washcloth to your face 3 to 4 times a day, or as directed by your caregiver.  Use saline nasal sprays to help moisten and clean your sinuses.  Take over-the-counter or prescription medicines for pain, discomfort, or fever only as directed by your caregiver. SEEK IMMEDIATE MEDICAL CARE IF:  You have increasing pain or severe headaches.  You have nausea, vomiting, or drowsiness.  You have swelling around your face.  You have vision problems.  You have a stiff neck.  You have difficulty breathing. MAKE SURE YOU:   Understand these instructions.  Will watch your condition.  Will get help right away if you are not doing well or get worse. Document Released: 09/27/2005 Document Revised: 12/20/2011 Document Reviewed: 10/12/2011 [facility] Patient Information 2015 [place], [place]. This information is not intended to replace advice given to you by your health care provider. Make sure you discuss any questions you have with your health care  provider. Cough, Adult  A cough is a reflex that helps clear your throat and airways. It can help heal the body or may be a reaction to an irritated airway. A cough may only last 2 or 3 weeks (acute) or may last more than 8 weeks (chronic).  CAUSES Acute cough:  Viral or bacterial infections. Chronic cough:  Infections.  Allergies.  Asthma.  Post-nasal drip.  Smoking.  Heartburn or acid reflux.  Some medicines.  Chronic lung problems (COPD).  Cancer. SYMPTOMS   Cough.  Fever.  Chest pain.  Increased breathing rate.  High-pitched whistling sound when breathing (wheezing).  Colored mucus that you cough  up (sputum). TREATMENT   A bacterial cough may be treated with antibiotic medicine.  A viral cough must run its course and will not respond to antibiotics.  Your caregiver may recommend other treatments if you have a chronic cough. HOME CARE INSTRUCTIONS   Only take over-the-counter or prescription medicines for pain, discomfort, or fever as directed by your caregiver. Use cough suppressants only as directed by your caregiver.  Use a cold steam vaporizer or humidifier in your bedroom or home to help loosen secretions.  Sleep in a semi-upright position if your cough is worse at night.  Rest as needed.  Stop smoking if you smoke. SEEK IMMEDIATE MEDICAL CARE IF:   You have pus in your sputum.  Your cough starts to worsen.  You cannot control your cough with suppressants and are losing sleep.  You begin coughing up blood.  You have difficulty breathing.  You develop pain which is getting worse or is uncontrolled with medicine.  You have a fever. MAKE SURE YOU:   Understand these instructions.  Will watch your condition.  Will get help right away if you are not doing well or get worse. Document Released: 03/26/2011 Document Revised: 12/20/2011 Document Reviewed: 03/26/2011 [facility] Patient Information 2015 [place], [place]. This information is not intended to replace advice given to you by your health care provider. Make sure you discuss any questions you have with your health care provider.

## 2014-04-26 NOTE — Progress Notes (Signed)
   Subjective:    Patient ID: Ronald HazardHossein B Kruser, male    DOB: 04/14/1956, 58 y.o.   MRN: [id number]  HPI  58 year old [place] male presents to clinic with complaints of headache and sore throat x 2 days , runny nose, congestion, cough with greenish product  X 3 days.  Cough kept pt up - unable to sleep due to cough.  Pt took Amoxicillin 875mg  x1 last pm that was left over from another illness.  No shortness of breath or breathing problems except for the congestion.  Sputum green, thick as is sinus nasal disch. No cp, sob.       Review of Systems     Objective:   Physical Exam  Constitutional: He is oriented to person, place, and time. He appears well-developed and well-nourished. No distress.  HENT:  Head: Normocephalic.  Right Ear: External ear normal.  Left Ear: External ear normal.  Nose: Mucosal edema, rhinorrhea and sinus tenderness present. Right sinus exhibits frontal sinus tenderness. Right sinus exhibits no maxillary sinus tenderness. Left sinus exhibits frontal sinus tenderness. Left sinus exhibits no maxillary sinus tenderness.  Mouth/Throat: No uvula swelling. No oropharyngeal exudate, posterior oropharyngeal edema or posterior oropharyngeal erythema.  Eyes: EOM are normal. Pupils are equal, round, and reactive to light.  Neck: Normal range of motion. Neck supple. No tracheal deviation present.  Cardiovascular: Normal rate, regular rhythm and normal heart sounds.   Pulmonary/Chest: Effort normal. Not tachypneic. No respiratory distress. He has no decreased breath sounds. He has no wheezes. He has rhonchi. He has no rales. He exhibits no tenderness.  Lymphadenopathy:    He has cervical adenopathy.  Neurological: He is alert and oriented to person, place, and time. He exhibits normal muscle tone. Coordination normal.  Psychiatric: He has a normal mood and affect.          Assessment & Plan:  Sinusitis/Cough Zithromax 500mg  5d Lortab prn elixir

## 2014-04-27 MED ORDER — AMOXICILLIN 875 MG PO TABS: 875.0000 mg | ORAL_TABLET | Freq: Two times a day (BID) | ORAL | Status: AC

## 2014-04-27 NOTE — Telephone Encounter (Signed)
Per Dr. Alwyn RenHopper, ok to switch to Amox 875 BID #20. Pt notified and stressed how very important it is to finish ALL of the abx. Pt understood. Also advised him to get Mucinex to help with his congestion,

## 2014-05-05 ENCOUNTER — Ambulatory Visit (INDEPENDENT_AMBULATORY_CARE_PROVIDER_SITE_OTHER): Payer: Self-pay

## 2014-05-05 ENCOUNTER — Ambulatory Visit (INDEPENDENT_AMBULATORY_CARE_PROVIDER_SITE_OTHER): Payer: Self-pay | Admitting: Internal Medicine

## 2014-05-05 VITALS — BP 166/84 | HR 66 | Temp 98.3°F | Resp 16 | Ht 68.25 in | Wt 173.4 lb

## 2014-05-05 DIAGNOSIS — R059 Cough, unspecified: Secondary | ICD-10-CM

## 2014-05-05 DIAGNOSIS — R05 Cough: Secondary | ICD-10-CM

## 2014-05-05 MED ORDER — AMOXICILLIN 875 MG PO TABS
875.0000 mg | ORAL_TABLET | Freq: Two times a day (BID) | ORAL | Status: AC
Start: 2014-05-05 — End: ?

## 2014-05-05 NOTE — Progress Notes (Addendum)
Subjective:    Patient ID: Ronald Brennan, male    DOB: 28-Dec-1955, 58 y.o.   MRN: [id number] This chart was scribed for Ellamae Sia, MD by Julian Hy, ED Scribe. The patient was seen in Room 4. The patient's care was started at 4:16 PM.   Chief Complaint  Patient presents with  . Cough    cough is a little better per pt.  he is still coughing up white sputum.     Patient Active Problem List   Diagnosis Date Noted  . GERD (gastroesophageal reflux disease) 08/01/2012  . AR (allergic rhinitis) 08/01/2012  . Insomnia/anxiety 08/01/2012  . HTN (hypertension) 08/01/2012  . Hyperlipidemia 08/01/2012  . RAD (reactive airway disease) 08/01/2012   Past Medical History  Diagnosis Date  . Allergy    Current Outpatient Prescriptions on File Prior to Visit  Medication Sig Dispense Refill  . amoxicillin (AMOXIL) 875 MG tablet Take 1 tablet (875 mg total) by mouth 2 (two) times daily.  20 tablet  0  . beclomethasone (QVAR) 40 MCG/ACT inhaler Inhale 2 puffs into the lungs 2 (two) times daily.  1 Inhaler  12  . Blood Pressure Monitoring (B-D ASSURE BPM/AUTO ARM CUFF) MISC 1 Device by Does not apply route daily.  1 each  0  . clonazePAM (KLONOPIN) 0.5 MG tablet TAKE 1/2 TO 1 TABLET BY MOUTH EVERY 12 HOURS AS NEEDED  30 tablet  3  . Multiple Vitamins-Minerals (CENTRUM SILVER ADULT 50+ PO) Take by mouth.      . pantoprazole (PROTONIX) 40 MG tablet Take 1 tablet (40 mg total) by mouth daily.  90 tablet  3  . simvastatin (ZOCOR) 20 MG tablet TAKE 1 TABLET BY MOUTH DAILY.  90 tablet  3  . valsartan (DIOVAN) 160 MG tablet TAKE 1 TABLET BY MOUTH DAILY.  90 tablet  3   No current facility-administered medications on file prior to visit.   No Known Allergies  HPI HPI Comments: CHI GARLOW is a 58 y.o. male who presents to the [facility] complaining of new, moderate cough onset two weeks ago. Pt states he feels like his lungs is filled with mucus. Pt states he had  fever at the beginning of his sickness. Pt states his mucus was yellow. Pt states that he feels 70% better than he was one week ago. Pt denies coughing at night. Pt states he coughs more with activity.   Pt states he had a cold last week with nasal congestion, post nasal drip, cough and headache. Pt came into [facility] and saw Dr. Perrin Maltese for his symptoms. Pt states he took azithromycin for 2 days and got an upset stomach. Pt states he switched over to Amoxicillin onset one week ago, Qvar, and Mucinex. Only 70%better.  Patient Active Problem List   Diagnosis Date Noted  . GERD (gastroesophageal reflux disease) 08/01/2012  . AR (allergic rhinitis) 08/01/2012  . Insomnia/anxiety 08/01/2012  . HTN (hypertension) 08/01/2012  . Hyperlipidemia 08/01/2012  . RAD (reactive airway disease) 08/01/2012   Has multiple ? Re mening after internet for HA w Cough  Review of Systems  Respiratory: Positive for cough.    no fever chills or night sweats No weight loss No exposure to TB Past history reactive airway disease    Objective:   Physical Exam  Nursing note and vitals reviewed. Constitutional: He is oriented to person, place, and time. He appears well-developed and well-nourished. No distress.  HENT:  Head: Normocephalic and  atraumatic.  Nares are congested. Throat clear. No nodes.   Eyes: Conjunctivae and EOM are normal.  Neck: Neck supple. No tracheal deviation present.  Cardiovascular: Normal rate.   Pulmonary/Chest: Effort normal. No respiratory distress. He has no decreased breath sounds. He has no wheezes. He has rhonchi in the right middle field. He has no rales.  Musculoskeletal: Normal range of motion.  Neurological: He is alert and oriented to person, place, and time.  Skin: Skin is warm and dry.  Psychiatric: He has a normal mood and affect. His behavior is normal.      Triage Vitals: BP 166/84  Pulse 66  Temp(Src) 98.3 F (36.8 C) (Oral)  Resp 16  Ht 5' 8.25" (1.734 m)  Wt 173  lb 6.4 oz (78.654 kg)  BMI 26.16 kg/m2  SpO2 98%  [facility] reading (PRIMARY) by  Dr.Manasa Spease=NAD     Assessment & Plan:  I have completed the patient encounter in its entirety as documented by the scribe, with editing by me where necessary. Ronald Brennan P. Ronald Brennan, M.D. Cough - Plan: DG Chest 2 View  Meds ordered this encounter  Medications  . amoxicillin (AMOXIL) 875 MG tablet---added to 10d course    Sig: Take 1 tablet (875 mg total) by mouth 2 (two) times daily.    Dispense:  14 tablet    Refill:  0   Liquid robitussin qvar one more week

## 2014-05-16 ENCOUNTER — Telehealth: Payer: Self-pay | Admitting: Internal Medicine

## 2014-05-16 NOTE — Telephone Encounter (Signed)
Patient called and states that he would like to know where he can purchase feed tubes to [place] to [place] father [place]. Tried to get a clinical staff member to answer his question however I was unable to reach anyone. Let patient know that I would put a phone message in EPIC and someone will call him back.   [phone]

## 2014-05-17 NOTE — Telephone Encounter (Signed)
Pt found the tubes that he needed online. He has ordered it and send to his father.

## 2014-12-06 IMAGING — US US SOFT TISSUE HEAD/NECK
1 series · 14 of 25 positions shown · non-contrast
Comparison: None.

CLINICAL DATA: Left upper pole thyroid nodule

EXAM:
THYROID ULTRASOUND
TECHNIQUE: Ultrasound examination of the thyroid gland and adjacent soft
tissues was performed.

[Series 1: us soft tissue head/neck · 0.10mm/px · 14 of 34 slices shown]
[im 1/34]
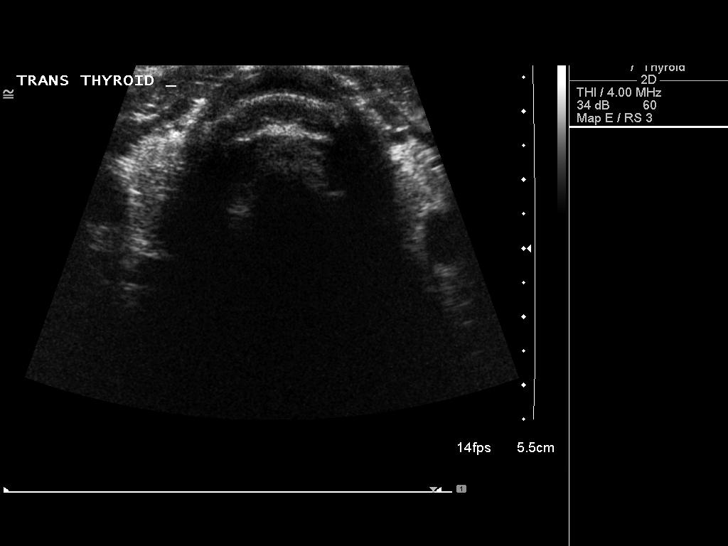
[im 3/34]
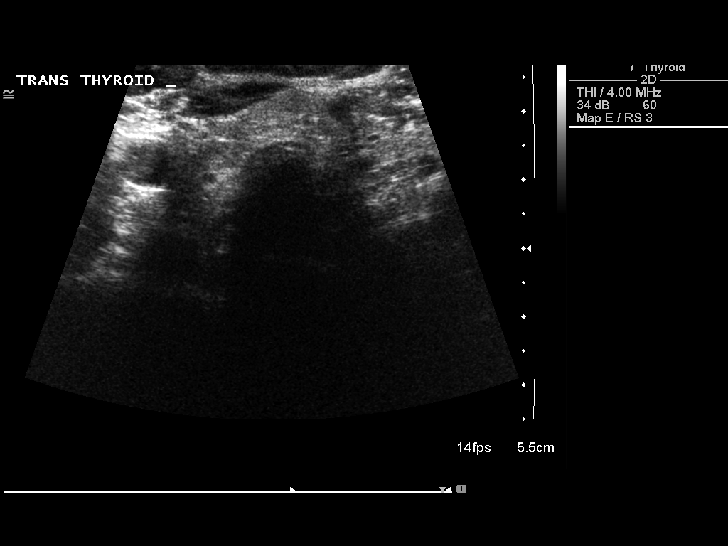
[im 6/34]
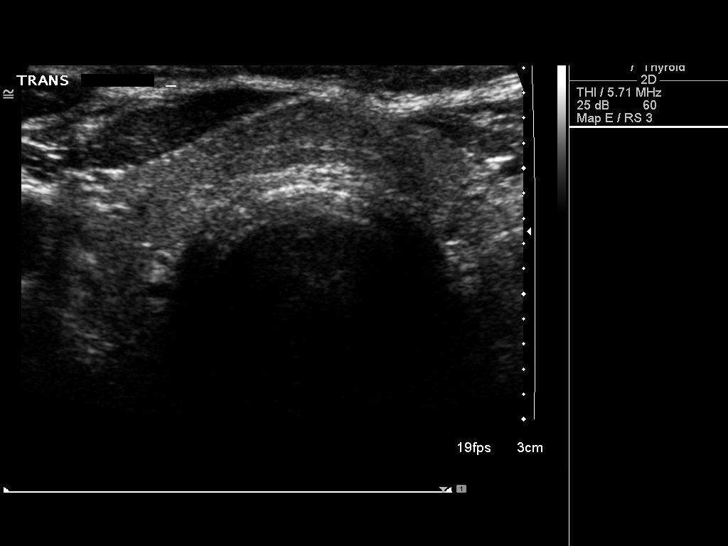
[im 9/34]
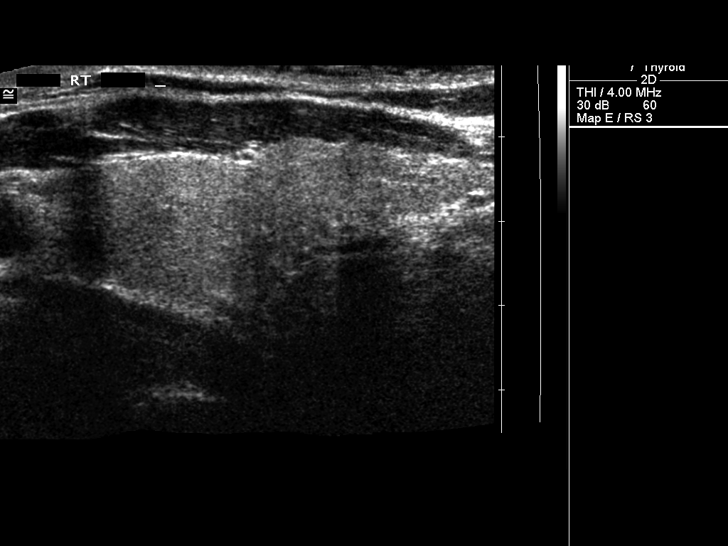
[im 12/34]
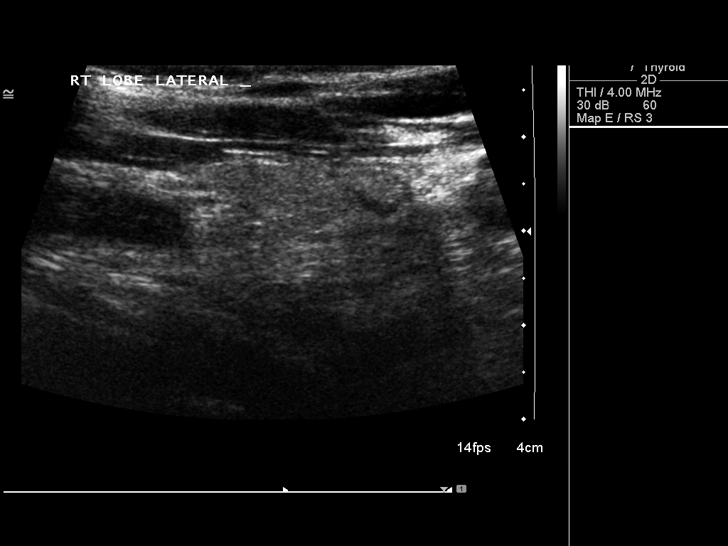
[im 13/34]
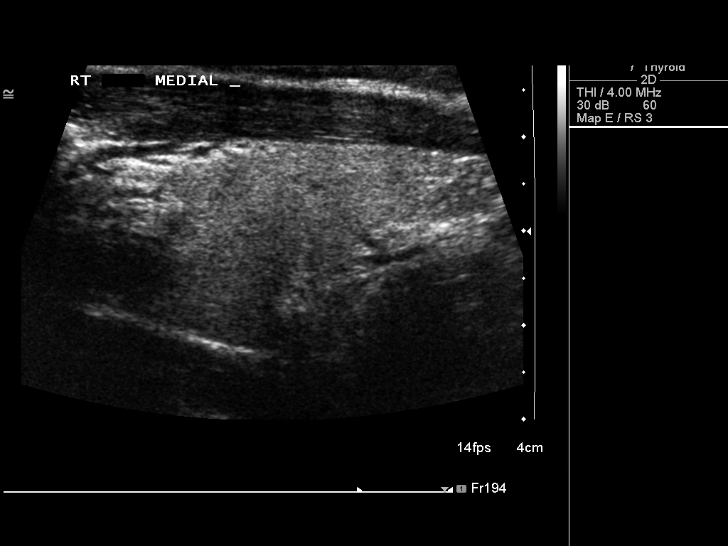
[im 16/34]
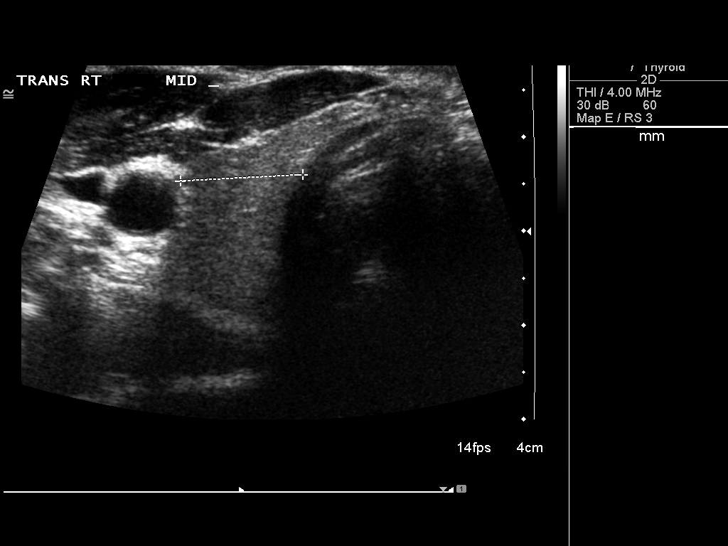
[im 18/34]
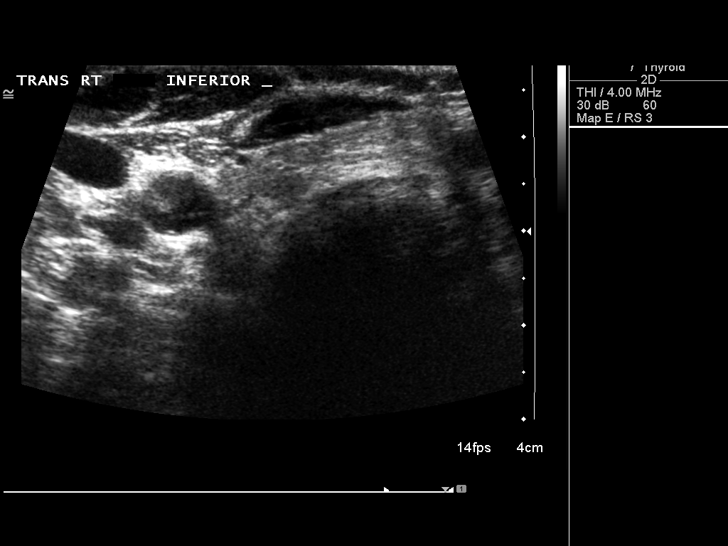
[im 21/34]
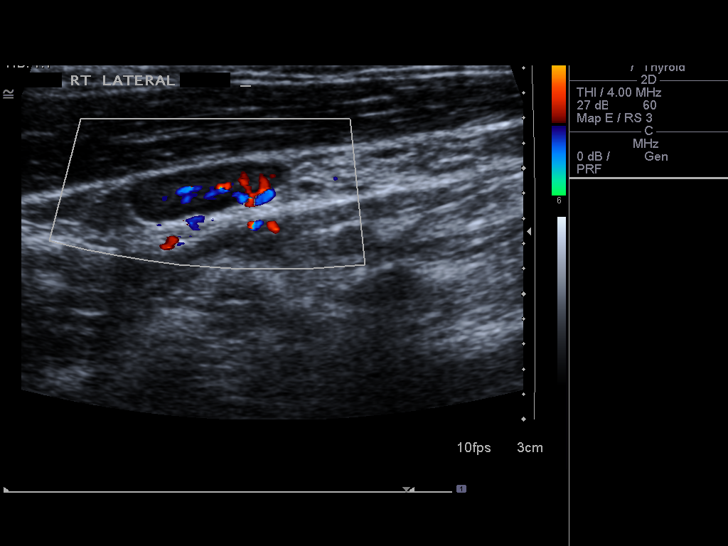
[im 23/34]
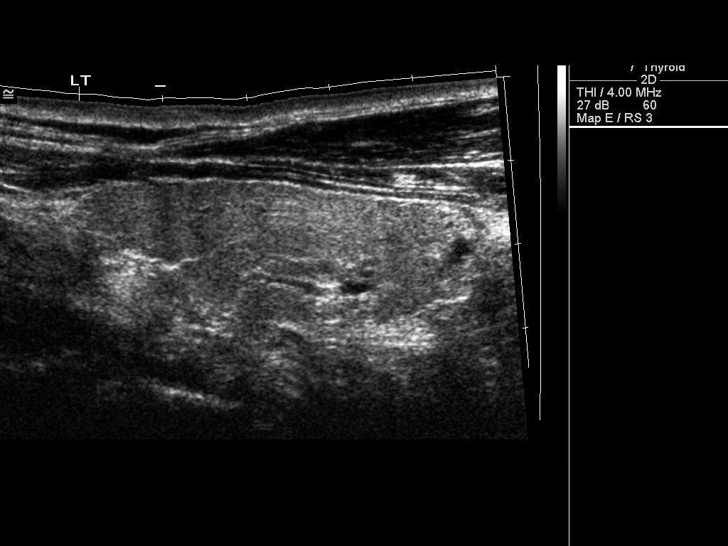
[im 25/34]
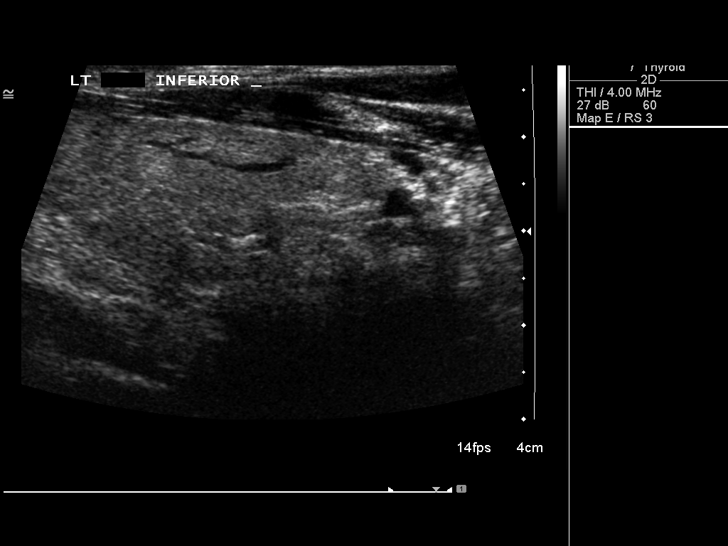
[im 28/34]
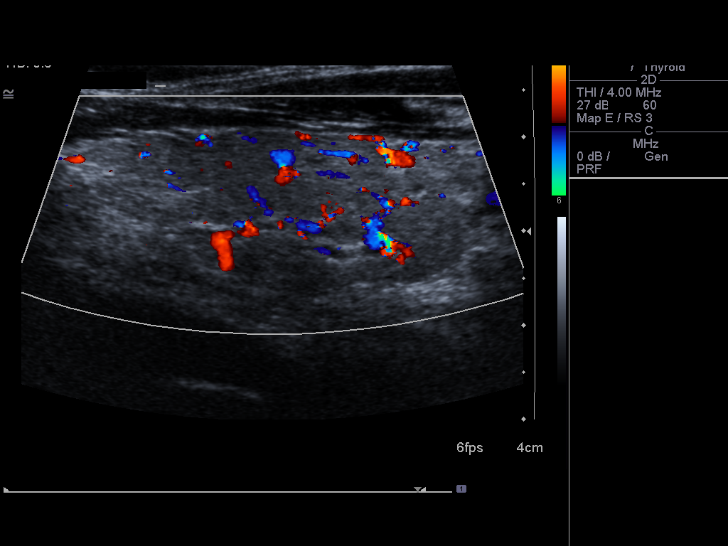
[im 31/34]
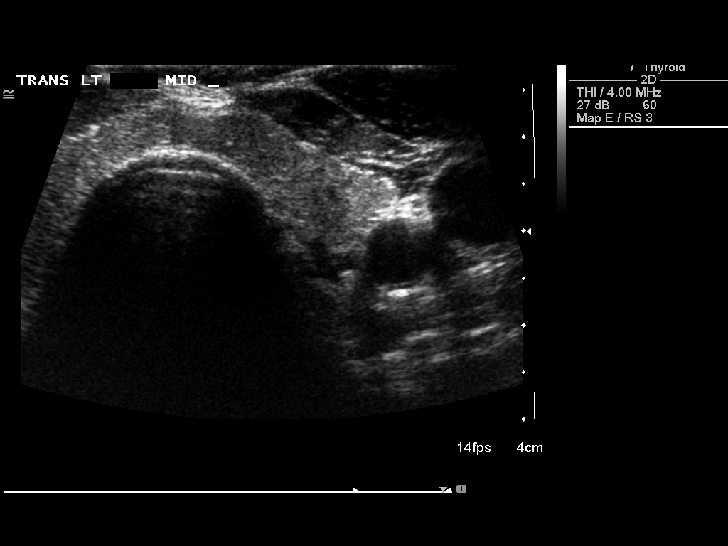
[im 34/34]
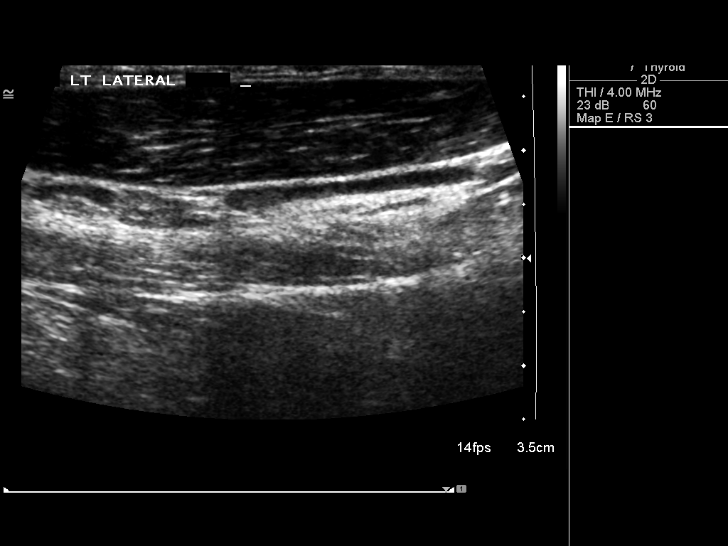

[14 of 25 positions shown; findings below may reference images not displayed]

FINDINGS: Right thyroid lobe

Measurements: 5.5 x 2.0 x 1.3 cm.  No nodules visualized.

Left thyroid lobe

Measurements: 5.1 x 1.0 x 1.4 cm.  No nodules visualized.

Isthmus

Thickness: 5 mm.  No nodules visualized.

Lymphadenopathy

None visualized.
IMPRESSION: No thyroid nodules. Within normal limits.

## 2015-02-05 ENCOUNTER — Encounter (HOSPITAL_COMMUNITY): Payer: Self-pay

## 2015-03-26 ENCOUNTER — Other Ambulatory Visit: Payer: Self-pay | Admitting: Internal Medicine

## 2015-04-28 ENCOUNTER — Telehealth: Payer: Self-pay

## 2015-04-28 NOTE — Telephone Encounter (Signed)
Pt was calling to see when his last tetnus shot was.  Please call [phone]

## 2015-04-29 NOTE — Telephone Encounter (Signed)
Called patient back let him know that the last Tdap we have on file for him is 05/08/2010. He stated he didn't need any other information at this time.

## 2015-09-02 IMAGING — CR DG CHEST 2V
2 series · 2 of 2 positions shown · non-contrast
Comparison: Chest x-ray 04/03/2009.

CLINICAL DATA: Cough.

EXAM:
CHEST  2 VIEW

[lateral]
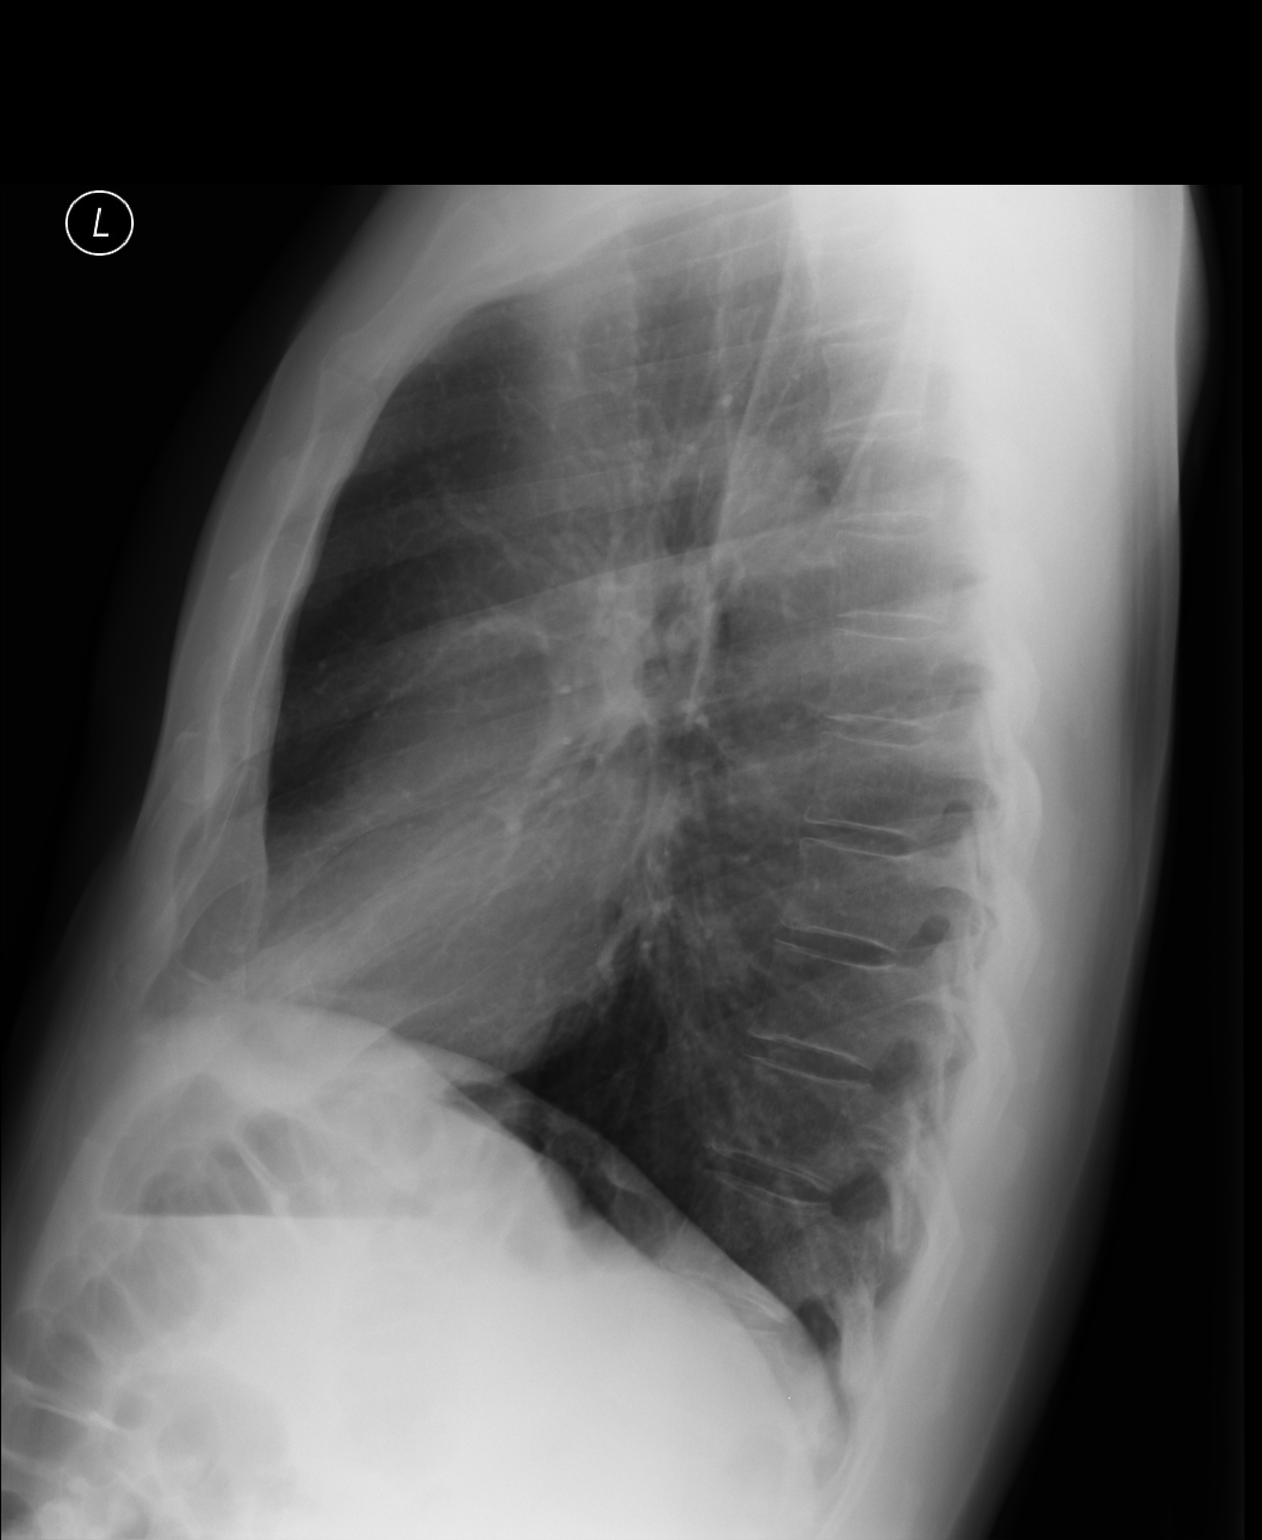

[PA]
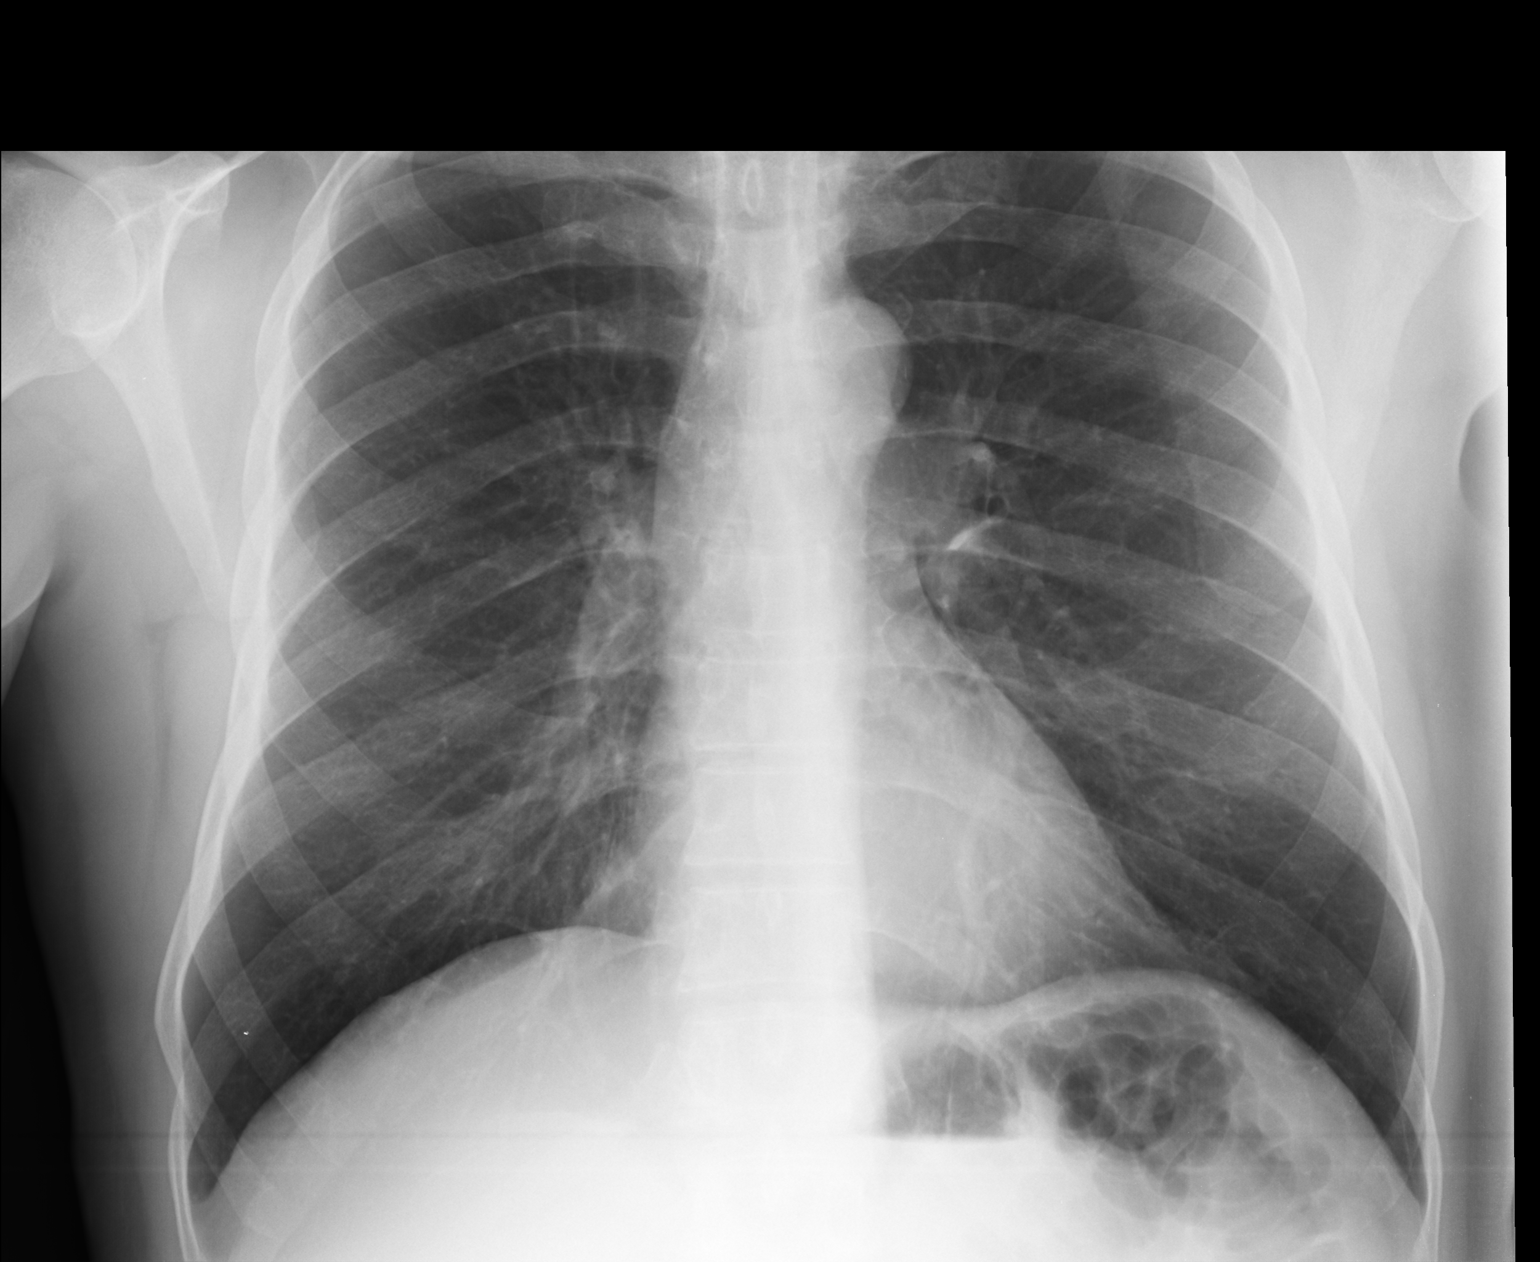

[2 of 2 positions shown; findings below may reference images not displayed]

FINDINGS: Lung volumes are normal. No consolidative airspace disease. No
pleural effusions. No pneumothorax. No pulmonary nodule or mass
noted. Pulmonary vasculature and the cardiomediastinal silhouette
are within normal limits.
IMPRESSION: No radiographic evidence of acute cardiopulmonary disease.

## 2023-01-21 DIAGNOSIS — S61240A Puncture wound with foreign body of right index finger without damage to nail, initial encounter: Secondary | ICD-10-CM | POA: Diagnosis not present

## 2023-01-21 DIAGNOSIS — Z23 Encounter for immunization: Secondary | ICD-10-CM | POA: Diagnosis not present

## 2023-01-21 DIAGNOSIS — X58XXXA Exposure to other specified factors, initial encounter: Secondary | ICD-10-CM | POA: Diagnosis not present

## 2023-02-03 DIAGNOSIS — Z0100 Encounter for examination of eyes and vision without abnormal findings: Secondary | ICD-10-CM | POA: Diagnosis not present

## 2023-02-03 DIAGNOSIS — H524 Presbyopia: Secondary | ICD-10-CM | POA: Diagnosis not present

## 2023-02-03 DIAGNOSIS — H25813 Combined forms of age-related cataract, bilateral: Secondary | ICD-10-CM | POA: Diagnosis not present
